# Patient Record
Sex: Male | Born: 1960 | Hispanic: No | Marital: Married | Smoking: Never smoker
Health system: Southern US, Community
[De-identification: ages and names within clinical notes are randomized; demographics above are authoritative.]

## PROBLEM LIST (undated history)

## (undated) DIAGNOSIS — Z789 Other specified health status: Secondary | ICD-10-CM

## (undated) DIAGNOSIS — K649 Unspecified hemorrhoids: Secondary | ICD-10-CM

## (undated) HISTORY — PX: APPENDECTOMY: SHX54

## (undated) HISTORY — PX: TONSILLECTOMY: SUR1361

## (undated) HISTORY — PX: OTHER SURGICAL HISTORY: SHX169

## (undated) HISTORY — DX: Unspecified hemorrhoids: K64.9

---

## 2001-02-20 ENCOUNTER — Encounter: Payer: Self-pay | Admitting: Orthopedic Surgery

## 2001-02-20 ENCOUNTER — Encounter: Admission: RE | Admit: 2001-02-20 | Discharge: 2001-02-20 | Payer: Self-pay | Admitting: Orthopedic Surgery

## 2001-02-21 ENCOUNTER — Encounter: Admission: RE | Admit: 2001-02-21 | Discharge: 2001-02-21 | Payer: Self-pay | Admitting: Orthopedic Surgery

## 2001-02-21 ENCOUNTER — Encounter: Payer: Self-pay | Admitting: Orthopedic Surgery

## 2009-01-12 ENCOUNTER — Encounter: Admission: RE | Admit: 2009-01-12 | Discharge: 2009-01-12 | Payer: Self-pay | Admitting: Orthopedic Surgery

## 2012-06-13 ENCOUNTER — Encounter (INDEPENDENT_AMBULATORY_CARE_PROVIDER_SITE_OTHER): Payer: Self-pay | Admitting: Surgery

## 2012-07-07 ENCOUNTER — Encounter (INDEPENDENT_AMBULATORY_CARE_PROVIDER_SITE_OTHER): Payer: Self-pay | Admitting: Surgery

## 2012-07-07 ENCOUNTER — Ambulatory Visit (INDEPENDENT_AMBULATORY_CARE_PROVIDER_SITE_OTHER): Payer: Managed Care, Other (non HMO) | Admitting: Surgery

## 2012-07-07 VITALS — BP 122/77 | HR 76 | Temp 98.3°F | Resp 14 | Ht 74.0 in | Wt 208.0 lb

## 2012-07-07 DIAGNOSIS — K648 Other hemorrhoids: Secondary | ICD-10-CM | POA: Insufficient documentation

## 2012-07-07 NOTE — Progress Notes (Signed)
Patient ID: Ryan Foley, male   DOB: 09-24-1961, 51 y.o.   MRN: [id number]  Chief Complaint  Patient presents with  . Rectal Problems    HPI Ryan Foley is a 51 y.o. male.  He is referred by Dr. Elnoria Howard for evaluation of a symptomatic prolapsing internal hemorrhoid. He has had this for many years. It is causing bleeding and started to protrude more. He has no discomfort. He denies any incontinence or control issues. He recently had a colonoscopy which was otherwise unremarkable HPI  Past Medical History  Diagnosis Date  . Hemorrhoid     Past Surgical History  Procedure Date  . Appendectomy   . Tonsillectomy   . Broken arm     left    History reviewed. No pertinent family history.  Social History History  Substance Use Topics  . Smoking status: Never Smoker   . Smokeless tobacco: Not on file  . Alcohol Use: Yes    No Known Allergies  Current Outpatient Prescriptions  Medication Sig Dispense Refill  . Multiple Vitamins-Minerals (MULTIVITAMIN WITH MINERALS) tablet Take 1 tablet by mouth daily.        Review of Systems Review of Systems  Constitutional: Negative for fever, chills and unexpected weight change.  HENT: Negative for hearing loss, congestion, sore throat, trouble swallowing and voice change.   Eyes: Negative for visual disturbance.  Respiratory: Negative for cough and wheezing.   Cardiovascular: Negative for chest pain, palpitations and leg swelling.  Gastrointestinal: Positive for blood in stool. Negative for nausea, vomiting, abdominal pain, diarrhea, constipation, abdominal distention, anal bleeding and rectal pain.  Genitourinary: Negative for hematuria and difficulty urinating.  Musculoskeletal: Negative for arthralgias.  Skin: Negative for rash and wound.  Neurological: Negative for seizures, syncope, weakness and headaches.  Hematological: Negative for adenopathy. Does not bruise/bleed easily.  Psychiatric/Behavioral: Negative for  confusion.    Blood pressure 122/77, pulse 76, temperature 98.3 F (36.8 C), temperature source Temporal, resp. rate 14, height 6\' 2"  (1.88 m), weight 208 lb (94.348 kg).  Physical Exam Physical Exam  Constitutional: He is oriented to person, place, and time. He appears well-developed and well-nourished. No distress.  HENT:  Head: Normocephalic and atraumatic.  Right Ear: External ear normal.  Left Ear: External ear normal.  Nose: Nose normal.  Eyes: Conjunctivae are normal. Pupils are equal, round, and reactive to light. No scleral icterus.  Neck: Normal range of motion. Neck supple. No tracheal deviation present.  Cardiovascular: Normal rate, regular rhythm, normal heart sounds and intact distal pulses.   No murmur heard. Pulmonary/Chest: Effort normal and breath sounds normal. No respiratory distress. He has no wheezes. He has no rales.  Abdominal: Soft. He exhibits no distension. There is no tenderness. There is no guarding.  Genitourinary:       The patient has a protruding internal hemorrhoid at the 5:00 position. The rest of the perianal skin is normal. Digital exam is otherwise normal. I performed anoscopy demonstrating the large internal hemorrhoid with no other abnormalities  Musculoskeletal: Normal range of motion. He exhibits no edema and no tenderness.  Lymphadenopathy:    He has no cervical adenopathy.  Neurological: He is alert and oriented to person, place, and time.  Skin: Skin is warm and dry. No rash noted. No erythema.  Psychiatric: His behavior is normal. Judgment normal.    Data Reviewed I have the notes from Dr. Elnoria Howard which I have reviewed  Assessment    Bleeding and prolapsing internal hemorrhoid  Plan    This is a fairly large and broad based internal hemorrhoid. It is close to the external area. I did not believe I can band this without causing discomfort.  I'm therefore recommending internal single column hemorrhoidectomy. I discussed this with him  in detail. I discussed the risks of surgery which includes but not limited to bleeding, infection, recurrence, postop pain, having open wound, etc. He understands and wishes to proceed. Surgery will be scheduled. Likelihood of success is good       Shivonne Schwartzman A 07/07/2012, 9:15 AM

## 2012-09-30 ENCOUNTER — Encounter (INDEPENDENT_AMBULATORY_CARE_PROVIDER_SITE_OTHER): Payer: Self-pay | Admitting: Surgery

## 2012-09-30 ENCOUNTER — Ambulatory Visit (INDEPENDENT_AMBULATORY_CARE_PROVIDER_SITE_OTHER): Payer: Managed Care, Other (non HMO) | Admitting: Surgery

## 2012-09-30 VITALS — BP 98/66 | HR 52 | Temp 98.2°F | Ht 74.0 in | Wt 211.2 lb

## 2012-09-30 DIAGNOSIS — Z01818 Encounter for other preprocedural examination: Secondary | ICD-10-CM

## 2012-09-30 NOTE — Progress Notes (Signed)
Subjective:     Patient ID: Ryan Foley, male   DOB: 1961/05/09, 51 y.o.   MRN: [id number]  HPI He has no issues since I saw him last.  Review of Systems     Objective:   Physical Exam The hemorrhoid remained in the same area    Assessment:     Symptomatic internal Hemorrhoid    Plan:        Surgery is scheduled

## 2012-10-03 ENCOUNTER — Encounter (HOSPITAL_COMMUNITY): Payer: Self-pay | Admitting: Pharmacist

## 2012-10-06 ENCOUNTER — Encounter (HOSPITAL_COMMUNITY): Payer: Self-pay

## 2012-10-06 ENCOUNTER — Encounter (HOSPITAL_COMMUNITY)
Admission: RE | Admit: 2012-10-06 | Discharge: 2012-10-06 | Disposition: A | Discharge status: Home / Self Care | Payer: Managed Care, Other (non HMO) | Source: Ambulatory Visit | Attending: Orthopaedic Surgery | Admitting: Orthopaedic Surgery

## 2012-10-06 HISTORY — DX: Other specified health status: Z78.9

## 2012-10-06 LAB — CBC
MCH: 31.6 pg (ref 26.0–34.0)
MCHC: 34 g/dL (ref 30.0–36.0)
MCV: 93 fL (ref 78.0–100.0)
Platelets: 218 10*3/uL (ref 150–400)
RBC: 4.3 MIL/uL (ref 4.22–5.81)

## 2012-10-06 LAB — BASIC METABOLIC PANEL
BUN: 15 mg/dL (ref 6–23)
CO2: 29 mEq/L (ref 19–32)
Calcium: 9.2 mg/dL (ref 8.4–10.5)
Creatinine, Ser: 0.95 mg/dL (ref 0.50–1.35)
Glucose, Bld: 93 mg/dL (ref 70–99)
Sodium: 137 mEq/L (ref 135–145)

## 2012-10-06 NOTE — Pre-Procedure Instructions (Signed)
20 Ryan Foley  10/06/2012   Your procedure is scheduled on:  10/17/12  Report to [facility] at 1145 AM.  Call this number if you have problems the morning of surgery: [phone]   Remember:   Do not eat food:After Midnight.  May have clear liquids:until Midnight .   Take these medicines the morning of surgery with A SIP OF WATER: none   Do not wear jewelry, make-up or nail polish.  Do not wear lotions, powders, or perfumes. You may wear deodorant.  Do not shave 48 hours prior to surgery. Men may shave face and neck.  Do not bring valuables to the hospital.  Contacts, dentures or bridgework may not be worn into surgery.  Leave suitcase in the car. After surgery it may be brought to your room.  For patients admitted to the hospital, checkout time is 11:00 AM the day of discharge.   Patients discharged the day of surgery will not be allowed to drive home.  Name and phone number of your driver: family  Special Instructions: Shower using CHG 2 nights before surgery and the night before surgery.  If you shower the day of surgery use CHG.  Use special wash - you have one bottle of CHG for all showers.  You should use approximately 1/3 of the bottle for each shower.   Please read over the following fact sheets that you were given: Pain Booklet, Coughing and Deep Breathing, Surgical Site Infection Prevention and Anesthesia Post-op Instructions

## 2012-10-16 NOTE — Progress Notes (Signed)
Pt. Notified of time change to arrive @ 11:00AM.

## 2012-10-16 NOTE — H&P (Signed)
Patient ID: Ryan Foley, male DOB: July 23, 1961, 51 y.o. MRN: [id number]  Chief Complaint   Patient presents with   .  Rectal Problems    HPI  Ryan Foley is a 51 y.o. male. He is referred by Dr. Elnoria Howard for evaluation of a symptomatic prolapsing internal hemorrhoid. He has had this for many years. It is causing bleeding and started to protrude more. He has no discomfort. He denies any incontinence or control issues. He recently had a colonoscopy which was otherwise unremarkable  HPI  Past Medical History   Diagnosis  Date   .  Hemorrhoid     Past Surgical History   Procedure  Date   .  Appendectomy    .  Tonsillectomy    .  Broken arm      left    History reviewed. No pertinent family history.  Social History  History   Substance Use Topics   .  Smoking status:  Never Smoker   .  Smokeless tobacco:  Not on file   .  Alcohol Use:  Yes    No Known Allergies  Current Outpatient Prescriptions   Medication  Sig  Dispense  Refill   .  Multiple Vitamins-Minerals (MULTIVITAMIN WITH MINERALS) tablet  Take 1 tablet by mouth daily.      Review of Systems  Review of Systems  Constitutional: Negative for fever, chills and unexpected weight change.  HENT: Negative for hearing loss, congestion, sore throat, trouble swallowing and voice change.  Eyes: Negative for visual disturbance.  Respiratory: Negative for cough and wheezing.  Cardiovascular: Negative for chest pain, palpitations and leg swelling.  Gastrointestinal: Positive for blood in stool. Negative for nausea, vomiting, abdominal pain, diarrhea, constipation, abdominal distention, anal bleeding and rectal pain.  Genitourinary: Negative for hematuria and difficulty urinating.  Musculoskeletal: Negative for arthralgias.  Skin: Negative for rash and wound.  Neurological: Negative for seizures, syncope, weakness and headaches.  Hematological: Negative for adenopathy. Does not bruise/bleed easily.  Psychiatric/Behavioral:  Negative for confusion.   Blood pressure 122/77, pulse 76, temperature 98.3 F (36.8 C), temperature source Temporal, resp. rate 14, height 6\' 2"  (1.88 m), weight 208 lb (94.348 kg).  Physical Exam  Physical Exam  Constitutional: He is oriented to person, place, and time. He appears well-developed and well-nourished. No distress.  HENT:  Head: Normocephalic and atraumatic.  Right Ear: External ear normal.  Left Ear: External ear normal.  Nose: Nose normal.  Eyes: Conjunctivae are normal. Pupils are equal, round, and reactive to light. No scleral icterus.  Neck: Normal range of motion. Neck supple. No tracheal deviation present.  Cardiovascular: Normal rate, regular rhythm, normal heart sounds and intact distal pulses.  No murmur heard.  Pulmonary/Chest: Effort normal and breath sounds normal. No respiratory distress. He has no wheezes. He has no rales.  Abdominal: Soft. He exhibits no distension. There is no tenderness. There is no guarding.  Genitourinary:  The patient has a protruding internal hemorrhoid at the 5:00 position. The rest of the perianal skin is normal. Digital exam is otherwise normal. I performed anoscopy demonstrating the large internal hemorrhoid with no other abnormalities  Musculoskeletal: Normal range of motion. He exhibits no edema and no tenderness.  Lymphadenopathy:  He has no cervical adenopathy.  Neurological: He is alert and oriented to person, place, and time.  Skin: Skin is warm and dry. No rash noted. No erythema.  Psychiatric: His behavior is normal. Judgment normal.   Data Reviewed  I have  the notes from Dr. Elnoria Howard which I have reviewed  Assessment   Bleeding and prolapsing internal hemorrhoid   Plan   This is a fairly large and broad based internal hemorrhoid. It is close to the external area. I did not believe I can band this without causing discomfort. I'm therefore recommending internal single column hemorrhoidectomy. I discussed this with him in  detail. I discussed the risks of surgery which includes but not limited to bleeding, infection, recurrence, postop pain, having open wound, etc. He understands and wishes to proceed. Surgery will be scheduled. Likelihood of success is good

## 2012-10-17 ENCOUNTER — Ambulatory Visit (HOSPITAL_COMMUNITY)
Admission: RE | Admit: 2012-10-17 | Discharge: 2012-10-17 | Disposition: A | Discharge status: Home / Self Care | Payer: Managed Care, Other (non HMO) | Source: Ambulatory Visit | Attending: Surgery | Admitting: Surgery

## 2012-10-17 ENCOUNTER — Ambulatory Visit (HOSPITAL_COMMUNITY): Payer: Managed Care, Other (non HMO) | Admitting: Anesthesiology

## 2012-10-17 ENCOUNTER — Encounter (HOSPITAL_COMMUNITY): Payer: Self-pay | Admitting: Anesthesiology

## 2012-10-17 ENCOUNTER — Encounter (HOSPITAL_COMMUNITY): Payer: Self-pay | Admitting: *Deleted

## 2012-10-17 ENCOUNTER — Encounter (HOSPITAL_COMMUNITY)
Admission: RE | Disposition: A | Discharge status: Home / Self Care | Payer: Self-pay | Source: Ambulatory Visit | Attending: Surgery

## 2012-10-17 DIAGNOSIS — K648 Other hemorrhoids: Secondary | ICD-10-CM | POA: Insufficient documentation

## 2012-10-17 DIAGNOSIS — Z01812 Encounter for preprocedural laboratory examination: Secondary | ICD-10-CM | POA: Insufficient documentation

## 2012-10-17 DIAGNOSIS — K644 Residual hemorrhoidal skin tags: Secondary | ICD-10-CM | POA: Insufficient documentation

## 2012-10-17 HISTORY — PX: HEMORRHOID SURGERY: SHX153

## 2012-10-17 SURGERY — HEMORRHOIDECTOMY
Anesthesia: General | Site: Rectum | Wound class: Contaminated

## 2012-10-17 MED ORDER — KETOROLAC TROMETHAMINE 30 MG/ML IJ SOLN
INTRAMUSCULAR | Status: DC | PRN
  Administered 2012-10-17: 30 mg via INTRAVENOUS

## 2012-10-17 MED ORDER — LIDOCAINE HCL (CARDIAC) 20 MG/ML IV SOLN
INTRAVENOUS | Status: DC | PRN
  Administered 2012-10-17: 100 mg via INTRAVENOUS

## 2012-10-17 MED ORDER — LACTATED RINGERS IV SOLN
INTRAVENOUS | Status: DC | PRN
  Administered 2012-10-17: 14:00:00 via INTRAVENOUS

## 2012-10-17 MED ORDER — DIBUCAINE 1 % RE OINT
TOPICAL_OINTMENT | RECTAL | Status: AC
  Filled 2012-10-17: qty 28

## 2012-10-17 MED ORDER — OXYCODONE HCL 5 MG PO TABS: 5.0000 mg | ORAL_TABLET | Freq: Once | ORAL | Status: DC | PRN

## 2012-10-17 MED ORDER — HYDROMORPHONE HCL PF 1 MG/ML IJ SOLN: 0.2500 mg | INTRAMUSCULAR | Status: DC | PRN

## 2012-10-17 MED ORDER — 0.9 % SODIUM CHLORIDE (POUR BTL) OPTIME
TOPICAL | Status: DC | PRN
  Administered 2012-10-17: 1000 mL

## 2012-10-17 MED ORDER — HEMOSTATIC AGENTS (NO CHARGE) OPTIME
TOPICAL | Status: DC | PRN
  Administered 2012-10-17: 1 via TOPICAL

## 2012-10-17 MED ORDER — ONDANSETRON HCL 4 MG/2ML IJ SOLN
INTRAMUSCULAR | Status: DC | PRN
  Administered 2012-10-17: 4 mg via INTRAVENOUS

## 2012-10-17 MED ORDER — FENTANYL CITRATE 0.05 MG/ML IJ SOLN
INTRAMUSCULAR | Status: DC | PRN
  Administered 2012-10-17 (×3): 50 ug via INTRAVENOUS

## 2012-10-17 MED ORDER — LACTATED RINGERS IV SOLN
INTRAVENOUS | Status: DC
  Administered 2012-10-17: 13:00:00 via INTRAVENOUS

## 2012-10-17 MED ORDER — DEXAMETHASONE SODIUM PHOSPHATE 4 MG/ML IJ SOLN
INTRAMUSCULAR | Status: DC | PRN
  Administered 2012-10-17: 8 mg via INTRAVENOUS

## 2012-10-17 MED ORDER — BUPIVACAINE LIPOSOME 1.3 % IJ SUSP
INTRAMUSCULAR | Status: DC | PRN
  Administered 2012-10-17: 20 mL

## 2012-10-17 MED ORDER — LIDOCAINE 5 % EX OINT: TOPICAL_OINTMENT | CUTANEOUS | Status: DC | PRN

## 2012-10-17 MED ORDER — CEFAZOLIN SODIUM-DEXTROSE 2-3 GM-% IV SOLR
INTRAVENOUS | Status: AC
  Filled 2012-10-17: qty 50

## 2012-10-17 MED ORDER — MIDAZOLAM HCL 5 MG/5ML IJ SOLN
INTRAMUSCULAR | Status: DC | PRN
  Administered 2012-10-17: 1 mg via INTRAVENOUS

## 2012-10-17 MED ORDER — OXYCODONE HCL 5 MG/5ML PO SOLN: 5.0000 mg | Freq: Once | ORAL | Status: DC | PRN

## 2012-10-17 MED ORDER — PROPOFOL 10 MG/ML IV BOLUS
INTRAVENOUS | Status: DC | PRN
  Administered 2012-10-17: 200 mg via INTRAVENOUS

## 2012-10-17 MED ORDER — BUPIVACAINE-EPINEPHRINE (PF) 0.5% -1:200000 IJ SOLN
INTRAMUSCULAR | Status: AC
  Filled 2012-10-17: qty 10

## 2012-10-17 MED ORDER — PROMETHAZINE HCL 25 MG/ML IJ SOLN: 6.2500 mg | INTRAMUSCULAR | Status: DC | PRN

## 2012-10-17 MED ORDER — HYDROCODONE-ACETAMINOPHEN 5-325 MG PO TABS: 1.0000 | ORAL_TABLET | ORAL | Status: DC | PRN

## 2012-10-17 SURGICAL SUPPLY — 35 items
BLADE SURG 15 STRL LF DISP TIS (BLADE) ×1 IMPLANT
BLADE SURG 15 STRL SS (BLADE) ×2
CANISTER SUCTION 2500CC (MISCELLANEOUS) ×2 IMPLANT
CLOTH BEACON ORANGE TIMEOUT ST (SAFETY) ×2 IMPLANT
COVER SURGICAL LIGHT HANDLE (MISCELLANEOUS) ×2 IMPLANT
DECANTER SPIKE VIAL GLASS SM (MISCELLANEOUS) IMPLANT
DRAPE PROXIMA HALF (DRAPES) ×2 IMPLANT
ELECT CAUTERY BLADE 6.4 (BLADE) ×2 IMPLANT
ELECT REM PT RETURN 9FT ADLT (ELECTROSURGICAL) ×2
ELECTRODE REM PT RTRN 9FT ADLT (ELECTROSURGICAL) ×1 IMPLANT
GAUZE SPONGE 4X4 16PLY XRAY LF (GAUZE/BANDAGES/DRESSINGS) ×2 IMPLANT
GLOVE SURG SIGNA 7.5 PF LTX (GLOVE) ×2 IMPLANT
GOWN PREVENTION PLUS XLARGE (GOWN DISPOSABLE) ×2 IMPLANT
GOWN STRL NON-REIN LRG LVL3 (GOWN DISPOSABLE) ×2 IMPLANT
KIT BASIN OR (CUSTOM PROCEDURE TRAY) ×2 IMPLANT
KIT ROOM TURNOVER OR (KITS) ×2 IMPLANT
NDL HYPO 25GX1X1/2 BEV (NEEDLE) ×1 IMPLANT
NEEDLE HYPO 25GX1X1/2 BEV (NEEDLE) ×2 IMPLANT
NS IRRIG 1000ML POUR BTL (IV SOLUTION) ×2 IMPLANT
PACK LITHOTOMY IV (CUSTOM PROCEDURE TRAY) ×2 IMPLANT
PAD ARMBOARD 7.5X6 YLW CONV (MISCELLANEOUS) ×2 IMPLANT
PENCIL BUTTON HOLSTER BLD 10FT (ELECTRODE) ×2 IMPLANT
SHEARS HARMONIC 9CM CVD (BLADE) ×1 IMPLANT
SPONGE GAUZE 4X4 12PLY (GAUZE/BANDAGES/DRESSINGS) ×2 IMPLANT
SPONGE SURGIFOAM ABS GEL 100 (HEMOSTASIS) ×1 IMPLANT
SURGILUBE 2OZ TUBE FLIPTOP (MISCELLANEOUS) ×2 IMPLANT
SUT CHROMIC 2 0 SH (SUTURE) ×3 IMPLANT
SYR BULB 3OZ (MISCELLANEOUS) ×2 IMPLANT
SYR CONTROL 10ML LL (SYRINGE) ×2 IMPLANT
TOWEL OR 17X24 6PK STRL BLUE (TOWEL DISPOSABLE) ×2 IMPLANT
TOWEL OR 17X26 10 PK STRL BLUE (TOWEL DISPOSABLE) ×2 IMPLANT
TRAY PROCTOSCOPIC FIBER OPTIC (SET/KITS/TRAYS/PACK) IMPLANT
TUBE CONNECTING 12X1/4 (SUCTIONS) ×2 IMPLANT
UNDERPAD 30X30 INCONTINENT (UNDERPADS AND DIAPERS) ×2 IMPLANT
YANKAUER SUCT BULB TIP NO VENT (SUCTIONS) ×2 IMPLANT

## 2012-10-17 NOTE — Anesthesia Postprocedure Evaluation (Signed)
Anesthesia Post Note  Patient: Ryan Foley  Procedure(s) Performed: Procedure(s) (LRB): HEMORRHOIDECTOMY (N/A)  Anesthesia type: general  Patient location: PACU  Post pain: Pain level controlled  Post assessment: Patient's Cardiovascular Status Stable  Last Vitals:  Filed Vitals:   10/17/12 1530  BP:   Pulse: 47  Temp:   Resp: 19    Post vital signs: Reviewed and stable  Level of consciousness: sedated  Complications: No apparent anesthesia complications

## 2012-10-17 NOTE — Transfer of Care (Signed)
Immediate Anesthesia Transfer of Care Note  Patient: Ryan Foley  Procedure(s) Performed: Procedure(s) (LRB) with comments: HEMORRHOIDECTOMY (N/A) - single column internal hemorrhoidectomy  Patient Location: PACU  Anesthesia Type:General  Level of Consciousness: awake, alert , oriented and patient cooperative  Airway & Oxygen Therapy: Patient Spontanous Breathing and Patient connected to nasal cannula oxygen  Post-op Assessment: Report given to PACU RN, Post -op Vital signs reviewed and stable and Patient moving all extremities  Post vital signs: Reviewed and stable  Complications: No apparent anesthesia complications

## 2012-10-17 NOTE — Op Note (Signed)
HEMORRHOIDECTOMY  Procedure Note  CURLEE BOGAN 10/17/2012   Pre-op Diagnosis: prolapsed hemorrhoids     Post-op Diagnosis: same  Procedure(s): 2 column internal/external hemorrhoidectomy  Surgeon(s): Shelly Rubenstein, MD  Anesthesia: General  Staff:  Evelena Peat, RN - Circulator Netta Corrigan, RN - Scrub Person Jani Files, [place] - Float Surgical Tech Maureen Ralphs, RN - Circulator Assistant  Estimated Blood Loss: Minimal               Specimens: hemorrhoids          Oleda Borski A   Date: 10/17/2012  Time: 2:27 PM

## 2012-10-17 NOTE — Preoperative (Signed)
Beta Blockers   Reason not to administer Beta Blockers:Not Applicable 

## 2012-10-17 NOTE — Interval H&P Note (Signed)
History and Physical Interval Note: no change in H and P  10/17/2012 12:13 PM  Ryan Foley  has presented today for surgery, with the diagnosis of prolapsed hemorrhoid  The various methods of treatment have been discussed with the patient and family. After consideration of risks, benefits and other options for treatment, the patient has consented to  Procedure(s) (LRB) with comments: HEMORRHOIDECTOMY (N/A) - single column internal hemorrhoidectomy as a surgical intervention .  The patient's history has been reviewed, patient examined, no change in status, stable for surgery.  I have reviewed the patient's chart and labs.  Questions were answered to the patient's satisfaction.     [name]

## 2012-10-17 NOTE — Progress Notes (Signed)
Care of pt assumed by MA Kaylib Furness RN 

## 2012-10-17 NOTE — Anesthesia Preprocedure Evaluation (Signed)
Anesthesia Evaluation  Patient identified by MRN, date of birth, ID band Patient awake    Reviewed: Allergy & Precautions, H&P , NPO status , Patient's Chart, lab work & pertinent test results  Airway Mallampati: II TM Distance: >3 FB Neck ROM: Full    Dental  (+) Teeth Intact and Dental Advisory Given   Pulmonary neg pulmonary ROS,    Pulmonary exam normal       Cardiovascular     Neuro/Psych negative neurological ROS     GI/Hepatic negative GI ROS, Neg liver ROS,   Endo/Other  negative endocrine ROS  Renal/GU negative Renal ROS     Musculoskeletal   Abdominal   Peds  Hematology   Anesthesia Other Findings   Reproductive/Obstetrics                           Anesthesia Physical Anesthesia Plan  ASA: I  Anesthesia Plan:    Post-op Pain Management:    Induction: Intravenous  Airway Management Planned: LMA  Additional Equipment:   Intra-op Plan:   Post-operative Plan: Extubation in OR  Informed Consent: I have reviewed the patients History and Physical, chart, labs and discussed the procedure including the risks, benefits and alternatives for the proposed anesthesia with the patient or authorized representative who has indicated his/her understanding and acceptance.   Dental advisory given  Plan Discussed with: CRNA, Anesthesiologist and Surgeon  Anesthesia Plan Comments:         Anesthesia Quick Evaluation

## 2012-10-18 NOTE — Op Note (Signed)
NAME:  [name], [name]           ACCOUNT NO.:  [id number]  MEDICAL RECORD NO.:  [id number]  LOCATION:  [facility]                         FACILITY:  [facility]  PHYSICIAN:  Abigail Miyamoto, M.D. DATE OF BIRTH:  Jul 25, 1961  DATE OF PROCEDURE:  10/17/2012 DATE OF DISCHARGE:                              OPERATIVE REPORT   PREOPERATIVE DIAGNOSIS:  Prolapsing hemorrhoids.  POSTOPERATIVE DIAGNOSIS:  Prolapsing hemorrhoids.  PROCEDURE:  Two common internal and external hemorrhoidectomy.  SURGEON:  Abigail Miyamoto, M.D.  ANESTHESIA:  General and injectable bupivacaine.  ESTIMATED BLOOD LOSS:  Minimal.  PROCEDURE IN DETAIL:  The patient was brought to the operating room, identified as Ryan Foley.  He was placed supine on the operating table and general anesthesia was induced.  The patient was placed in the lithotomy position.  His perianal area was then prepped and draped in the usual sterile fashion.  The patient had 2 large protruding internal/external hemorrhoidal columns.  I anesthetized the perianal area circumferentially with Exparel.  I then grasped this hemorrhoidal columns with Allis clamp and excised them in their entirety with the Harmonic Scalpel.  I then closed the mucosal defects with interlocked running 2-0 chromic sutures.  The rest of the perianal exam was normal. I placed a piece of Gelfoam in the anal canal tape with hemostasis.  I then injected further Exparel circumferentially around the perianal area.  Gauze was then applied.  The patient tolerated the procedure well.  All counts were correct at the end of the procedure.  The patient was then extubated in the operating room and taken in a stable condition to the recovery room.     Abigail Miyamoto, M.D.     DB/MEDQ  D:  10/17/2012  T:  10/18/2012  Job:  [id number]

## 2012-10-20 ENCOUNTER — Telehealth (INDEPENDENT_AMBULATORY_CARE_PROVIDER_SITE_OTHER): Payer: Self-pay | Admitting: General Surgery

## 2012-10-20 ENCOUNTER — Encounter (HOSPITAL_COMMUNITY): Payer: Self-pay | Admitting: Surgery

## 2012-10-20 NOTE — Telephone Encounter (Signed)
Pt called for advice on BM.  He had hem surgery on Friday and is feeling pressure to have bowel movement, but cannot go.  Pt stated he may have packing so instructed he will need to remove it first.  He has been taking stool softener and Miralax since DOS.  He states his is reluctant to have a BM, secondary to pain and bleeding potential as well.  Reminded him to shower with soap and water, rinse well and pat dry.  Use warm tube soaks or sitz baths as needed.  Pt understands.

## 2012-10-31 ENCOUNTER — Encounter (INDEPENDENT_AMBULATORY_CARE_PROVIDER_SITE_OTHER): Payer: Self-pay | Admitting: Surgery

## 2012-10-31 ENCOUNTER — Ambulatory Visit (INDEPENDENT_AMBULATORY_CARE_PROVIDER_SITE_OTHER): Payer: Managed Care, Other (non HMO) | Admitting: Surgery

## 2012-10-31 VITALS — BP 110/78 | HR 60 | Temp 97.7°F | Resp 18 | Ht 74.0 in | Wt 210.8 lb

## 2012-10-31 DIAGNOSIS — Z09 Encounter for follow-up examination after completed treatment for conditions other than malignant neoplasm: Secondary | ICD-10-CM

## 2012-10-31 NOTE — Progress Notes (Signed)
Subjective:     Patient ID: Ryan Foley, male   DOB: 1961/04/12, 51 y.o.   MRN: [id number]  HPI He is here for his first postop visit status post 2 column hemorrhoidectomy. He reports he is doing moderately well and has only mild drainage and mild pain  Review of Systems     Objective:   Physical Exam On exam, he still has an open area with excellent granulation tissue in the perianal area. The final pathology showed benign hemorrhoidal tissue    Assessment:     Patient stable postop    Plan:     He will continue his current bowel regimen and I will see him back in one month

## 2012-12-01 ENCOUNTER — Ambulatory Visit (INDEPENDENT_AMBULATORY_CARE_PROVIDER_SITE_OTHER): Payer: Managed Care, Other (non HMO) | Admitting: Surgery

## 2012-12-01 ENCOUNTER — Encounter (INDEPENDENT_AMBULATORY_CARE_PROVIDER_SITE_OTHER): Payer: Self-pay | Admitting: Surgery

## 2012-12-01 VITALS — BP 112/74 | HR 64 | Temp 98.4°F | Resp 16 | Ht 74.0 in | Wt 215.6 lb

## 2012-12-01 DIAGNOSIS — Z09 Encounter for follow-up examination after completed treatment for conditions other than malignant neoplasm: Secondary | ICD-10-CM

## 2012-12-01 NOTE — Progress Notes (Signed)
Subjective:     Patient ID: Ryan Foley, male   DOB: [birth year], 52 y.o.   MRN: [id number]  HPI  He is here for a final postop visit. He is off stool softeners and moving his bowels well with no drainage Review of Systems     Objective:   Physical Exam His wounds are completely healed    Assessment:     Patient stable postop    Plan:     I will see him back as needed

## 2017-02-02 ENCOUNTER — Encounter (HOSPITAL_COMMUNITY): Payer: Self-pay | Admitting: *Deleted

## 2017-02-02 ENCOUNTER — Ambulatory Visit (HOSPITAL_COMMUNITY)
Admission: EM | Admit: 2017-02-02 | Discharge: 2017-02-02 | Disposition: A | Discharge status: Home / Self Care | Payer: 59 | Attending: Radiology | Admitting: Radiology

## 2017-02-02 DIAGNOSIS — J069 Acute upper respiratory infection, unspecified: Secondary | ICD-10-CM

## 2017-02-02 MED ORDER — BENZONATATE 100 MG PO CAPS: 100.0000 mg | ORAL_CAPSULE | Freq: Three times a day (TID) | ORAL | 0 refills | Status: DC

## 2017-02-02 MED ORDER — CYCLOBENZAPRINE HCL 10 MG PO TABS: 10.0000 mg | ORAL_TABLET | Freq: Two times a day (BID) | ORAL | 0 refills | Status: DC | PRN

## 2017-02-02 MED ORDER — AMOXICILLIN-POT CLAVULANATE 875-125 MG PO TABS: 1.0000 | ORAL_TABLET | Freq: Two times a day (BID) | ORAL | 0 refills | Status: DC

## 2017-02-02 NOTE — Discharge Instructions (Signed)
Continue to push fluids and take over the counter medications as directed on the back of the box for symptomatic relief.  ° °

## 2017-02-02 NOTE — ED Provider Notes (Signed)
CSN: [id number]     Arrival date & time 02/02/17  1217 History   None    Chief Complaint  Patient presents with  . Cough   (Consider location/radiation/quality/duration/timing/severity/associated sxs/prior Treatment) 56 y.o. male presents with productive cough that is worse at night, nasal congestion and sore throat X 4 days and lower back pain that occurred while gardening. Condition is acute in nature. Condition is made better by nothing. Condition is made worse by nothing. Patient denies any relief from OTC medications taken prior to there arrival at this facility.        Past Medical History:  Diagnosis Date  . Hemorrhoid   . No pertinent past medical history    Past Surgical History:  Procedure Laterality Date  . APPENDECTOMY    . broken arm     left  . HEMORRHOID SURGERY  10/17/2012   Procedure: HEMORRHOIDECTOMY;  Surgeon: Shelly Rubenstein, MD;  Location: [facility] OR;  Service: General;  Laterality: N/A;  single column internal hemorrhoidectomy  . TONSILLECTOMY     History reviewed. No pertinent family history. Social History  Substance Use Topics  . Smoking status: Never Smoker  . Smokeless tobacco: Never Used  . Alcohol use Yes    Review of Systems  Constitutional: Negative for chills and fever.  HENT: Positive for congestion and sore throat. Negative for ear pain and sinus pain.   Eyes: Negative for pain and visual disturbance.  Respiratory: Positive for cough. Negative for shortness of breath.   Cardiovascular: Negative for chest pain and palpitations.  Gastrointestinal: Negative for abdominal pain and vomiting.  Genitourinary: Negative for dysuria and hematuria.  Musculoskeletal: Negative for arthralgias and back pain.  Skin: Negative for color change and rash.  Neurological: Negative for seizures and syncope.  All other systems reviewed and are negative.   Allergies  Patient has no known allergies.  Home Medications   Prior to Admission medications    Medication Sig Start Date End Date Taking? Authorizing Provider  amoxicillin-clavulanate (AUGMENTIN) 875-125 MG tablet Take 1 tablet by mouth every 12 (twelve) hours. 02/02/17   Alene Mires, NP  benzonatate (TESSALON) 100 MG capsule Take 1 capsule (100 mg total) by mouth every 8 (eight) hours. 02/02/17   Alene Mires, NP  cyclobenzaprine (FLEXERIL) 10 MG tablet Take 1 tablet (10 mg total) by mouth 2 (two) times daily as needed for muscle spasms. 02/02/17   Alene Mires, NP   Meds Ordered and Administered this Visit  Medications - No data to display  BP 134/80 (BP Location: Right Arm)   Pulse 78   Temp 98.6 F (37 C) (Oral)   Resp 18  No data found.   Physical Exam  Constitutional: He appears well-developed and well-nourished.  HENT:  Head: Normocephalic and atraumatic.  Right Ear: External ear normal.  Left Ear: External ear normal.   Post nasal drip noted.   Eyes: Conjunctivae are normal.  Neck: Neck supple.  Cardiovascular: Normal rate, regular rhythm and normal heart sounds.   No murmur heard. Pulmonary/Chest: Effort normal and breath sounds normal. No respiratory distress. He has no wheezes. He has no rales.  Abdominal: Soft. There is no tenderness.  Musculoskeletal: He exhibits no edema.  Neurological: He is alert.  Skin: Skin is warm and dry.  Psychiatric: He has a normal mood and affect.  Nursing note and vitals reviewed.   Urgent Care Course     Procedures (including critical care time)  Labs Review Labs Reviewed -  No data to display  Imaging Review No results found.      MDM   1. Viral upper respiratory tract infection       Alene MiresJennifer C Tanequa Kretz, NP 02/02/17 1327

## 2017-02-02 NOTE — ED Triage Notes (Signed)
Pt  Reports   Cough    And  Congested   Body  Aches   X   3days        Back  Pain

## 2017-04-03 ENCOUNTER — Other Ambulatory Visit: Payer: Self-pay

## 2017-04-03 ENCOUNTER — Encounter (HOSPITAL_COMMUNITY): Payer: Self-pay

## 2017-04-03 ENCOUNTER — Emergency Department (HOSPITAL_COMMUNITY): Payer: 59

## 2017-04-03 ENCOUNTER — Emergency Department (HOSPITAL_COMMUNITY)
Admission: EM | Admit: 2017-04-03 | Discharge: 2017-04-03 | Disposition: A | Discharge status: Home / Self Care | Payer: 59 | Attending: Emergency Medicine | Admitting: Emergency Medicine

## 2017-04-03 DIAGNOSIS — Z79899 Other long term (current) drug therapy: Secondary | ICD-10-CM | POA: Insufficient documentation

## 2017-04-03 DIAGNOSIS — R0789 Other chest pain: Secondary | ICD-10-CM | POA: Diagnosis present

## 2017-04-03 DIAGNOSIS — H8110 Benign paroxysmal vertigo, unspecified ear: Secondary | ICD-10-CM | POA: Diagnosis not present

## 2017-04-03 LAB — I-STAT TROPONIN, ED
TROPONIN I, POC: 0.01 ng/mL (ref 0.00–0.08)
Troponin i, poc: 0.02 ng/mL (ref 0.00–0.08)

## 2017-04-03 LAB — BASIC METABOLIC PANEL
ANION GAP: 9 (ref 5–15)
BUN: 21 mg/dL — ABNORMAL HIGH (ref 6–20)
CHLORIDE: 106 mmol/L (ref 101–111)
CO2: 23 mmol/L (ref 22–32)
Calcium: 9 mg/dL (ref 8.9–10.3)
Creatinine, Ser: 0.98 mg/dL (ref 0.61–1.24)
GFR calc Af Amer: >60 mL/min (ref >60)
GLUCOSE: 99 mg/dL (ref 65–99)
POTASSIUM: 3.9 mmol/L (ref 3.5–5.1)
Sodium: 138 mmol/L (ref 135–145)

## 2017-04-03 LAB — CBC
HEMATOCRIT: 40.5 % (ref 39.0–52.0)
HEMOGLOBIN: 13.8 g/dL (ref 13.0–17.0)
MCH: 31.5 pg (ref 26.0–34.0)
MCHC: 34.1 g/dL (ref 30.0–36.0)
MCV: 92.5 fL (ref 78.0–100.0)
Platelets: 208 10*3/uL (ref 150–400)
RBC: 4.38 MIL/uL (ref 4.22–5.81)
RDW: 13 % (ref 11.5–15.5)
WBC: 5.3 10*3/uL (ref 4.0–10.5)

## 2017-04-03 NOTE — ED Provider Notes (Signed)
Assumed care from Dr. Elesa MassedWard at shift change.  See her note for full H&P.  Briefly, 56 y.o. M here with 15 seconds of left sided chest pain last evening.  Initial labs and CXR negative.  EKG without acute ischemic changes.  Patient's father did have mild MI in 6940's.  Patient without other known cardiac risk factors.  Plan: delta trop at 0730, if negative can discharge home.  Results for orders placed or performed during the hospital encounter of 04/03/17  Basic metabolic panel  Result Value Ref Range   Sodium 138 135 - 145 mmol/L   Potassium 3.9 3.5 - 5.1 mmol/L   Chloride 106 101 - 111 mmol/L   CO2 23 22 - 32 mmol/L   Glucose, Bld 99 65 - 99 mg/dL   BUN 21 (H) 6 - 20 mg/dL   Creatinine, Ser 1.300.98 0.61 - 1.24 mg/dL   Calcium 9.0 8.9 - 86.510.3 mg/dL   GFR calc non Af Amer >60 >60 mL/min   GFR calc Af Amer >60 >60 mL/min   Anion gap 9 5 - 15  CBC  Result Value Ref Range   WBC 5.3 4.0 - 10.5 K/uL   RBC 4.38 4.22 - 5.81 MIL/uL   Hemoglobin 13.8 13.0 - 17.0 g/dL   HCT 78.440.5 69.639.0 - 29.552.0 %   MCV 92.5 78.0 - 100.0 fL   MCH 31.5 26.0 - 34.0 pg   MCHC 34.1 30.0 - 36.0 g/dL   RDW 28.413.0 13.211.5 - 44.015.5 %   Platelets 208 150 - 400 K/uL  I-stat troponin, ED  Result Value Ref Range   Troponin i, poc 0.02 0.00 - 0.08 ng/mL   Comment 3          I-stat troponin, ED  Result Value Ref Range   Troponin i, poc 0.01 0.00 - 0.08 ng/mL   Comment 3           Dg Chest 2 View  Result Date: 04/03/2017 CLINICAL DATA:  Chest pain and left arm pain day awoke the patient up tonight. EXAM: CHEST  2 VIEW COMPARISON:  None. FINDINGS: Normal heart size and pulmonary vascularity. Emphysematous changes in the lungs. No focal consolidation or airspace disease. No blunting of costophrenic angles. No pneumothorax. Mediastinal contours appear intact. IMPRESSION: Emphysematous changes in the lungs. No evidence of active pulmonary disease. Electronically Signed   By: Burman NievesWilliam  Stevens M.D.   On: 04/03/2017 04:43     Delta trop  negative.  Patient has been resting here, remains chest pain free. Stable VS.  Feel he is stable for discharge home.  Recommended close PCP follow-up-- given copies of labs and imaging studies from today;s visit for physician review.  Discussed plan with patient, he acknowledged understanding and agreed with plan of care.  Return precautions given for new or worsening symptoms.   Garlon HatchetSanders, Mabel Unrein M, PA-C 04/03/17 [id number]    Lorre NickAllen, Anthony, MD 04/03/17 [phone]

## 2017-04-03 NOTE — ED Triage Notes (Signed)
Pt here for chest pain onset last night at 1 am. sts recently had work up for heart and was good. Had episode recently of blacking out that led him to the work up. Then now with chest pain and minor vertigo.

## 2017-04-03 NOTE — Discharge Instructions (Addendum)
Your cardiac work up today was negative. Recommend to follow-up with your primary care doctor. Return here for any new/worsening symptoms.

## 2017-04-03 NOTE — ED Notes (Signed)
ekg shown to dr Blinda Leatherwoodpollina

## 2017-04-03 NOTE — ED Provider Notes (Signed)
TIME SEEN: 5:30 AM  CHIEF COMPLAINT: Chest pain  HPI: Patient is a 56 year old male with no synovial past medical history who presents emergency department with complaints of chest pain. States that around 1 AM the chest pain will confirm sleep. He describes it as diffusely throughout the left chest into the left abdomen and lasted probably 10-15 seconds. He states he felt like it was a tightness and a "muscle ache". Had associated shortness of breath but no nausea, vomiting, diaphoresis or dizziness. Has intermittently had vertigo since March. States that is worse with sitting upright and movement. No significant vertigo currently. Has meclizine at home. No headache, vision changes, numbness, tingling or focal weakness. He denies history of hypertension, diabetes, hyperlipidemia, tobacco use. States his father did have a "mild heart attack" in his 240s. Patient reports he has had a stress test in his 30s that was negative. No recent stress test or cardiac catheterization. [place] [address].  He is chest pain-free currently.  ROS: See HPI Constitutional: no fever  Eyes: no drainage  ENT: no runny nose   Cardiovascular:   chest pain  Resp:  SOB  GI: no vomiting GU: no dysuria Integumentary: no rash  Allergy: no hives  Musculoskeletal: no leg swelling  Neurological: no slurred speech ROS otherwise negative  PAST MEDICAL HISTORY/PAST SURGICAL HISTORY:  Past Medical History:  Diagnosis Date  . Hemorrhoid   . No pertinent past medical history     MEDICATIONS:  Prior to Admission medications   Medication Sig Start Date End Date Taking? Authorizing Provider  ZYLET 0.5-0.3 % SUSP Place 1 drop into the left eye 4 (four) times daily.  04/01/17  Yes [provider]  amoxicillin-clavulanate (AUGMENTIN) 875-125 MG tablet Take 1 tablet by mouth every 12 (twelve) hours. Patient not taking: Reported on 04/03/2017 02/02/17   Alene Miresmohundro, Jennifer C, NP   benzonatate (TESSALON) 100 MG capsule Take 1 capsule (100 mg total) by mouth every 8 (eight) hours. Patient not taking: Reported on 04/03/2017 02/02/17   Alene Miresmohundro, Jennifer C, NP  cyclobenzaprine (FLEXERIL) 10 MG tablet Take 1 tablet (10 mg total) by mouth 2 (two) times daily as needed for muscle spasms. Patient not taking: Reported on 04/03/2017 02/02/17   Alene Miresmohundro, Jennifer C, NP    ALLERGIES:  No Known Allergies  SOCIAL HISTORY:  Social History  Substance Use Topics  . Smoking status: Never Smoker  . Smokeless tobacco: Never Used  . Alcohol use Yes    FAMILY HISTORY: History reviewed. No pertinent family history.  EXAM: BP (!) 119/91 (BP Location: Right Arm)   Pulse (!) 57   Temp 97.8 F (36.6 C) (Oral)   Resp 17   Ht 6\' 2"  (1.88 m)   Wt 97.5 kg (215 lb)   SpO2 99%   BMI 27.60 kg/m  CONSTITUTIONAL: Alert and oriented and responds appropriately to questions. Well-appearing; well-nourished HEAD: Normocephalic EYES: Conjunctivae clear, pupils appear equal, EOMI ENT: normal nose; moist mucous membranes NECK: Supple, no meningismus, no nuchal rigidity, no LAD  CARD: RRR; S1 and S2 appreciated; no murmurs, no clicks, no rubs, no gallops RESP: Normal chest excursion without splinting or tachypnea; breath sounds clear and equal bilaterally; no wheezes, no rhonchi, no rales, no hypoxia or respiratory distress, speaking full sentences ABD/GI: Normal bowel sounds; non-distended; soft, non-tender, no rebound, no guarding, no peritoneal signs, no hepatosplenomegaly BACK:  The back appears normal and is non-tender to palpation, there is no CVA tenderness  EXT: Normal ROM in all joints; non-tender to palpation; no edema; normal capillary refill; no cyanosis, no calf tenderness or swelling    SKIN: Normal color for age and race; warm; no rash NEURO: Moves all extremities equally, sensation to light touch intact diffusely, cranial nerves II through XII intact, normal speech PSYCH: The  patient's mood and manner are appropriate. Grooming and personal hygiene are appropriate.  MEDICAL DECISION MAKING: Patient here symptoms of very atypical chest pain. His only risk factors for ACS are age and family history. First troponin negative. EKG shows no ischemic abnormality. Chest x-ray clear. Plan is to repeat second troponin 3 hours and if negative discharge home with outpatient follow-up.   Patient also complains of intermittent vertigo since March. Symptoms only present with changes in position. Otherwise neurologically intact. No headache. Denies vertigo currently and denies meclizine. Suspect that this is peripheral in nature. Doubt stroke. I do not feel he needs emergent head imaging. Has prescription for meclizine at home.  ED PROGRESS: 7:00 AM  Signed out to Sharilyn Sites, PA who will follow-up on patient's repeat troponin and reassess symptoms. Again if patient is asymptomatic and second troponin is negative, I feel comfortable with discharge home as does patient.  I reviewed all nursing notes, vitals, pertinent previous records, EKGs, lab and urine results, imaging (as available).      EKG Interpretation  Date/Time:  Wednesday Apr 03 2017 05:36:56 EDT Ventricular Rate:  53 PR Interval:    QRS Duration: 92 QT Interval:  438 QTC Calculation: 412 R Axis:   90 Text Interpretation:  Sinus rhythm Borderline right axis deviation Anteroseptal infarct, old No old tracing to compare Confirmed by WARD,  DO, KRISTEN [id number]) on 04/03/2017 6:50:47 AM         Ward, Layla Maw, DO 04/03/17 1914

## 2017-11-09 ENCOUNTER — Inpatient Hospital Stay (HOSPITAL_COMMUNITY)
Admission: EM | Admit: 2017-11-09 | Discharge: 2017-11-11 | DRG: 470 | Disposition: A | Discharge status: Home / Self Care | Payer: 59 | Attending: Orthopedic Surgery | Admitting: Orthopedic Surgery

## 2017-11-09 ENCOUNTER — Other Ambulatory Visit: Payer: Self-pay

## 2017-11-09 ENCOUNTER — Emergency Department (HOSPITAL_COMMUNITY): Payer: 59

## 2017-11-09 ENCOUNTER — Inpatient Hospital Stay (HOSPITAL_COMMUNITY): Payer: 59

## 2017-11-09 ENCOUNTER — Encounter (HOSPITAL_COMMUNITY): Payer: Self-pay

## 2017-11-09 DIAGNOSIS — R402413 Glasgow coma scale score 13-15, at hospital admission: Secondary | ICD-10-CM | POA: Diagnosis present

## 2017-11-09 DIAGNOSIS — W500XXA Accidental hit or strike by another person, initial encounter: Secondary | ICD-10-CM | POA: Diagnosis present

## 2017-11-09 DIAGNOSIS — Z8781 Personal history of (healed) traumatic fracture: Secondary | ICD-10-CM | POA: Insufficient documentation

## 2017-11-09 DIAGNOSIS — Z419 Encounter for procedure for purposes other than remedying health state, unspecified: Secondary | ICD-10-CM

## 2017-11-09 DIAGNOSIS — S72001A Fracture of unspecified part of neck of right femur, initial encounter for closed fracture: Principal | ICD-10-CM | POA: Diagnosis present

## 2017-11-09 DIAGNOSIS — Z01811 Encounter for preprocedural respiratory examination: Secondary | ICD-10-CM

## 2017-11-09 DIAGNOSIS — Y9355 Activity, bike riding: Secondary | ICD-10-CM | POA: Diagnosis not present

## 2017-11-09 DIAGNOSIS — S72009A Fracture of unspecified part of neck of unspecified femur, initial encounter for closed fracture: Secondary | ICD-10-CM | POA: Diagnosis not present

## 2017-11-09 HISTORY — DX: Other specified health status: Z78.9

## 2017-11-09 LAB — COMPREHENSIVE METABOLIC PANEL
ALK PHOS: 48 U/L (ref 38–126)
ALT: 21 U/L (ref 17–63)
AST: 33 U/L (ref 15–41)
Albumin: 3.8 g/dL (ref 3.5–5.0)
Anion gap: 5 (ref 5–15)
BUN: 15 mg/dL (ref 6–20)
CALCIUM: 8.9 mg/dL (ref 8.9–10.3)
CHLORIDE: 104 mmol/L (ref 101–111)
CO2: 27 mmol/L (ref 22–32)
CREATININE: 1.08 mg/dL (ref 0.61–1.24)
Glucose, Bld: 100 mg/dL — ABNORMAL HIGH (ref 65–99)
Potassium: 3.8 mmol/L (ref 3.5–5.1)
Sodium: 136 mmol/L (ref 135–145)
Total Bilirubin: 0.7 mg/dL (ref 0.3–1.2)
Total Protein: 6.3 g/dL — ABNORMAL LOW (ref 6.5–8.1)

## 2017-11-09 LAB — CBC WITH DIFFERENTIAL/PLATELET
BASOS PCT: 0 %
Basophils Absolute: 0 10*3/uL (ref 0.0–0.1)
EOS ABS: 0.3 10*3/uL (ref 0.0–0.7)
EOS PCT: 4 %
HCT: 42.2 % (ref 39.0–52.0)
Hemoglobin: 14 g/dL (ref 13.0–17.0)
LYMPHS ABS: 1 10*3/uL (ref 0.7–4.0)
Lymphocytes Relative: 14 %
MCH: 31 pg (ref 26.0–34.0)
MCHC: 33.2 g/dL (ref 30.0–36.0)
MCV: 93.4 fL (ref 78.0–100.0)
MONOS PCT: 5 %
Monocytes Absolute: 0.3 10*3/uL (ref 0.1–1.0)
NEUTROS PCT: 77 %
Neutro Abs: 5.1 10*3/uL (ref 1.7–7.7)
PLATELETS: 201 10*3/uL (ref 150–400)
RBC: 4.52 MIL/uL (ref 4.22–5.81)
RDW: 12.8 % (ref 11.5–15.5)
WBC: 6.6 10*3/uL (ref 4.0–10.5)

## 2017-11-09 LAB — URINALYSIS, ROUTINE W REFLEX MICROSCOPIC
BILIRUBIN URINE: NEGATIVE
GLUCOSE, UA: NEGATIVE mg/dL
HGB URINE DIPSTICK: NEGATIVE
KETONES UR: 5 mg/dL — AB
Leukocytes, UA: NEGATIVE
Nitrite: NEGATIVE
PH: 6 (ref 5.0–8.0)
PROTEIN: NEGATIVE mg/dL
Specific Gravity, Urine: 1.015 (ref 1.005–1.030)

## 2017-11-09 LAB — TYPE AND SCREEN
ABO/RH(D): AB POS
ANTIBODY SCREEN: NEGATIVE

## 2017-11-09 LAB — ABO/RH: ABO/RH(D): AB POS

## 2017-11-09 LAB — APTT: APTT: 30 s (ref 24–36)

## 2017-11-09 MED ORDER — SENNOSIDES-DOCUSATE SODIUM 8.6-50 MG PO TABS: 1.0000 | ORAL_TABLET | Freq: Every evening | ORAL | Status: DC | PRN

## 2017-11-09 MED ORDER — HYDROCODONE-ACETAMINOPHEN 5-325 MG PO TABS
1.0000 | ORAL_TABLET | Freq: Four times a day (QID) | ORAL | Status: DC | PRN
  Administered 2017-11-09 – 2017-11-10 (×3): 2 via ORAL
  Filled 2017-11-09 (×3): qty 2

## 2017-11-09 MED ORDER — LOTEPREDNOL-TOBRAMYCIN 0.5-0.3 % OP SUSP: 1.0000 [drp] | Freq: Four times a day (QID) | OPHTHALMIC | Status: DC

## 2017-11-09 MED ORDER — HEPARIN SODIUM (PORCINE) 5000 UNIT/ML IJ SOLN: 5000.0000 [IU] | Freq: Three times a day (TID) | INTRAMUSCULAR | Status: DC

## 2017-11-09 MED ORDER — HYDROMORPHONE HCL 1 MG/ML IJ SOLN
0.5000 mg | Freq: Once | INTRAMUSCULAR | Status: AC
  Administered 2017-11-09: 0.5 mg via INTRAVENOUS
  Filled 2017-11-09: qty 1

## 2017-11-09 MED ORDER — HYDROMORPHONE HCL 1 MG/ML IJ SOLN
0.5000 mg | INTRAMUSCULAR | Status: DC | PRN
  Administered 2017-11-09: 0.5 mg via INTRAVENOUS
  Filled 2017-11-09: qty 1

## 2017-11-09 MED ORDER — MORPHINE SULFATE (PF) 4 MG/ML IV SOLN
4.0000 mg | Freq: Once | INTRAVENOUS | Status: AC
  Administered 2017-11-09: 4 mg via INTRAVENOUS
  Filled 2017-11-09: qty 1

## 2017-11-09 MED ORDER — SODIUM CHLORIDE 0.9 % IV BOLUS (SEPSIS)
1000.0000 mL | Freq: Once | INTRAVENOUS | Status: AC
  Administered 2017-11-09: 1000 mL via INTRAVENOUS

## 2017-11-09 MED ORDER — TETANUS-DIPHTH-ACELL PERTUSSIS 5-2.5-18.5 LF-MCG/0.5 IM SUSP
0.5000 mL | Freq: Once | INTRAMUSCULAR | Status: AC
  Administered 2017-11-09: 0.5 mL via INTRAMUSCULAR
  Filled 2017-11-09: qty 0.5

## 2017-11-09 MED ORDER — HYDROMORPHONE HCL 1 MG/ML IJ SOLN: 0.5000 mg | INTRAMUSCULAR | Status: DC | PRN

## 2017-11-09 MED ORDER — ONDANSETRON HCL 4 MG/2ML IJ SOLN
4.0000 mg | Freq: Once | INTRAMUSCULAR | Status: AC
  Administered 2017-11-09: 4 mg via INTRAVENOUS
  Filled 2017-11-09: qty 2

## 2017-11-09 MED ORDER — BISACODYL 5 MG PO TBEC: 5.0000 mg | DELAYED_RELEASE_TABLET | Freq: Every day | ORAL | Status: DC | PRN

## 2017-11-09 MED ORDER — FLEET ENEMA 7-19 GM/118ML RE ENEM: 1.0000 | ENEMA | Freq: Once | RECTAL | Status: DC | PRN

## 2017-11-09 MED ORDER — SODIUM CHLORIDE 0.9 % IV SOLN
INTRAVENOUS | Status: DC
  Administered 2017-11-09: 15:00:00 via INTRAVENOUS
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
## 2017-11-09 NOTE — ED Notes (Signed)
Patient transported to X-ray 
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
## 2017-11-09 NOTE — Progress Notes (Signed)
Orthopedic Tech Progress Note Patient Details:  Vaughan BastaMichael F Hunton [birth year] [id number]      Post Interventions Patient Tolerated: Fair Instructions Provided: Care of device, Adjustment of device   Saul FordyceJennifer C Edlin Ford 11/09/2017, 3:46 PM
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
## 2017-11-09 NOTE — ED Notes (Signed)
Attempted to call report

## 2017-11-09 NOTE — Progress Notes (Signed)
Orthopedic Tech Progress Note Patient Details:  Vaughan BastaMichael F Rossman 09-16-1961 [id number]      Post Interventions Patient Tolerated: Well Instructions Provided: Care of device, Adjustment of device   Saul FordyceJennifer C Giorgi Debruin 11/09/2017, 6:21 PM

## 2017-11-10 ENCOUNTER — Other Ambulatory Visit: Payer: Self-pay | Admitting: Internal Medicine

## 2017-11-10 ENCOUNTER — Inpatient Hospital Stay (HOSPITAL_COMMUNITY): Payer: 59 | Admitting: Certified Registered Nurse Anesthetist

## 2017-11-10 ENCOUNTER — Encounter (HOSPITAL_COMMUNITY)
Admission: EM | Disposition: A | Discharge status: Home / Self Care | Payer: Self-pay | Source: Home / Self Care | Attending: Orthopedic Surgery

## 2017-11-10 ENCOUNTER — Encounter (HOSPITAL_COMMUNITY): Payer: Self-pay | Admitting: Certified Registered Nurse Anesthetist

## 2017-11-10 ENCOUNTER — Inpatient Hospital Stay (HOSPITAL_COMMUNITY): Payer: 59

## 2017-11-10 DIAGNOSIS — Z8781 Personal history of (healed) traumatic fracture: Secondary | ICD-10-CM

## 2017-11-10 HISTORY — PX: TOTAL HIP ARTHROPLASTY: SHX124

## 2017-11-10 LAB — BASIC METABOLIC PANEL
Anion gap: 7 (ref 5–15)
BUN: 10 mg/dL (ref 6–20)
CALCIUM: 8.4 mg/dL — AB (ref 8.9–10.3)
CHLORIDE: 102 mmol/L (ref 101–111)
CO2: 26 mmol/L (ref 22–32)
CREATININE: 1.03 mg/dL (ref 0.61–1.24)
Glucose, Bld: 111 mg/dL — ABNORMAL HIGH (ref 65–99)
Potassium: 3.9 mmol/L (ref 3.5–5.1)
SODIUM: 135 mmol/L (ref 135–145)

## 2017-11-10 LAB — CBC
HCT: 40 % (ref 39.0–52.0)
HEMOGLOBIN: 13.2 g/dL (ref 13.0–17.0)
MCH: 31.5 pg (ref 26.0–34.0)
MCHC: 33 g/dL (ref 30.0–36.0)
MCV: 95.5 fL (ref 78.0–100.0)
PLATELETS: 215 10*3/uL (ref 150–400)
RBC: 4.19 MIL/uL — ABNORMAL LOW (ref 4.22–5.81)
RDW: 13.2 % (ref 11.5–15.5)
WBC: 8 10*3/uL (ref 4.0–10.5)

## 2017-11-10 LAB — MRSA PCR SCREENING: MRSA BY PCR: NEGATIVE

## 2017-11-10 LAB — HIV ANTIBODY (ROUTINE TESTING W REFLEX): HIV SCREEN 4TH GENERATION: NONREACTIVE

## 2017-11-10 SURGERY — ARTHROPLASTY, HIP, TOTAL, ANTERIOR APPROACH
Anesthesia: General | Site: Hip | Laterality: Right

## 2017-11-10 MED ORDER — ONDANSETRON HCL 4 MG/2ML IJ SOLN
INTRAMUSCULAR | Status: AC
  Filled 2017-11-10: qty 2

## 2017-11-10 MED ORDER — PROPOFOL 10 MG/ML IV BOLUS
INTRAVENOUS | Status: AC
  Filled 2017-11-10: qty 20

## 2017-11-10 MED ORDER — DOCUSATE SODIUM 100 MG PO CAPS
100.0000 mg | ORAL_CAPSULE | Freq: Two times a day (BID) | ORAL | 0 refills | Status: DC
Start: 2017-11-10 — End: 2022-12-04

## 2017-11-10 MED ORDER — LIDOCAINE 2% (20 MG/ML) 5 ML SYRINGE
INTRAMUSCULAR | Status: DC | PRN
  Administered 2017-11-10: 60 mg via INTRAVENOUS

## 2017-11-10 MED ORDER — GABAPENTIN 300 MG PO CAPS
300.0000 mg | ORAL_CAPSULE | Freq: Once | ORAL | Status: AC
  Administered 2017-11-10: 300 mg via ORAL
  Filled 2017-11-10: qty 1

## 2017-11-10 MED ORDER — METHOCARBAMOL 500 MG PO TABS: 500.0000 mg | ORAL_TABLET | Freq: Four times a day (QID) | ORAL | 0 refills | Status: DC | PRN

## 2017-11-10 MED ORDER — DOCUSATE SODIUM 100 MG PO CAPS
100.0000 mg | ORAL_CAPSULE | Freq: Two times a day (BID) | ORAL | Status: DC
  Administered 2017-11-10 – 2017-11-11 (×3): 100 mg via ORAL
  Filled 2017-11-10 (×3): qty 1

## 2017-11-10 MED ORDER — TRANEXAMIC ACID 1000 MG/10ML IV SOLN
INTRAVENOUS | Status: AC | PRN
  Administered 2017-11-10: 2000 mg via TOPICAL

## 2017-11-10 MED ORDER — ONDANSETRON HCL 4 MG PO TABS: 4.0000 mg | ORAL_TABLET | Freq: Three times a day (TID) | ORAL | 0 refills | Status: DC | PRN

## 2017-11-10 MED ORDER — ACETAMINOPHEN 650 MG RE SUPP: 650.0000 mg | RECTAL | Status: DC | PRN

## 2017-11-10 MED ORDER — LACTATED RINGERS IV SOLN
INTRAVENOUS | Status: DC | PRN
  Administered 2017-11-10: 07:00:00 via INTRAVENOUS

## 2017-11-10 MED ORDER — POLYETHYLENE GLYCOL 3350 17 G PO PACK: 17.0000 g | PACK | Freq: Every day | ORAL | Status: DC | PRN

## 2017-11-10 MED ORDER — HYDROCODONE-ACETAMINOPHEN 5-325 MG PO TABS
1.0000 | ORAL_TABLET | ORAL | Status: DC | PRN
  Administered 2017-11-10: 2 via ORAL
  Filled 2017-11-10 (×3): qty 2

## 2017-11-10 MED ORDER — SODIUM CHLORIDE FLUSH 0.9 % IV SOLN
INTRAVENOUS | Status: DC | PRN
  Administered 2017-11-10: 30 mL via INTRAVENOUS

## 2017-11-10 MED ORDER — SORBITOL 70 % SOLN: 30.0000 mL | Freq: Every day | Status: DC | PRN

## 2017-11-10 MED ORDER — ONDANSETRON HCL 4 MG PO TABS: 4.0000 mg | ORAL_TABLET | Freq: Four times a day (QID) | ORAL | Status: DC | PRN

## 2017-11-10 MED ORDER — FENTANYL CITRATE (PF) 250 MCG/5ML IJ SOLN
INTRAMUSCULAR | Status: DC | PRN
  Administered 2017-11-10: 100 ug via INTRAVENOUS
  Administered 2017-11-10: 50 ug via INTRAVENOUS

## 2017-11-10 MED ORDER — DIPHENHYDRAMINE HCL 12.5 MG/5ML PO ELIX: 12.5000 mg | ORAL_SOLUTION | ORAL | Status: DC | PRN

## 2017-11-10 MED ORDER — TRANEXAMIC ACID 1000 MG/10ML IV SOLN
2000.0000 mg | INTRAVENOUS | Status: AC
  Filled 2017-11-10: qty 20

## 2017-11-10 MED ORDER — DEXAMETHASONE SODIUM PHOSPHATE 10 MG/ML IJ SOLN
INTRAMUSCULAR | Status: AC
  Filled 2017-11-10: qty 1

## 2017-11-10 MED ORDER — PHENOL 1.4 % MT LIQD: 1.0000 | OROMUCOSAL | Status: DC | PRN

## 2017-11-10 MED ORDER — TRANEXAMIC ACID 1000 MG/10ML IV SOLN
2000.0000 mg | Freq: Once | INTRAVENOUS | Status: DC
  Filled 2017-11-10: qty 20

## 2017-11-10 MED ORDER — METHOCARBAMOL 500 MG PO TABS
ORAL_TABLET | ORAL | Status: AC
  Filled 2017-11-10: qty 1

## 2017-11-10 MED ORDER — LACTATED RINGERS IV SOLN: INTRAVENOUS | Status: DC

## 2017-11-10 MED ORDER — CEFAZOLIN SODIUM-DEXTROSE 1-4 GM/50ML-% IV SOLN
1.0000 g | Freq: Four times a day (QID) | INTRAVENOUS | Status: AC
  Administered 2017-11-10 (×2): 1 g via INTRAVENOUS
  Filled 2017-11-10 (×2): qty 50

## 2017-11-10 MED ORDER — METHOCARBAMOL 500 MG PO TABS
500.0000 mg | ORAL_TABLET | Freq: Four times a day (QID) | ORAL | Status: DC | PRN
  Administered 2017-11-10: 500 mg via ORAL
  Filled 2017-11-10: qty 1

## 2017-11-10 MED ORDER — ACETAMINOPHEN 325 MG PO TABS: 650.0000 mg | ORAL_TABLET | ORAL | Status: DC | PRN

## 2017-11-10 MED ORDER — HYDROMORPHONE HCL 1 MG/ML IJ SOLN
0.2500 mg | INTRAMUSCULAR | Status: DC | PRN
  Administered 2017-11-10 (×2): 0.5 mg via INTRAVENOUS

## 2017-11-10 MED ORDER — FLEET ENEMA 7-19 GM/118ML RE ENEM: 1.0000 | ENEMA | Freq: Once | RECTAL | Status: DC | PRN

## 2017-11-10 MED ORDER — PROPOFOL 10 MG/ML IV BOLUS
INTRAVENOUS | Status: DC | PRN
Start: 2017-11-10 — End: 2017-11-10
  Administered 2017-11-10: 160 mg via INTRAVENOUS

## 2017-11-10 MED ORDER — TRANEXAMIC ACID 1000 MG/10ML IV SOLN
1000.0000 mg | INTRAVENOUS | Status: AC
  Administered 2017-11-10: 1000 mg via INTRAVENOUS
  Filled 2017-11-10: qty 10

## 2017-11-10 MED ORDER — MENTHOL 3 MG MT LOZG: 1.0000 | LOZENGE | OROMUCOSAL | Status: DC | PRN

## 2017-11-10 MED ORDER — LIDOCAINE 2% (20 MG/ML) 5 ML SYRINGE
INTRAMUSCULAR | Status: AC
  Filled 2017-11-10: qty 5

## 2017-11-10 MED ORDER — DEXAMETHASONE SODIUM PHOSPHATE 10 MG/ML IJ SOLN
INTRAMUSCULAR | Status: DC | PRN
  Administered 2017-11-10: 10 mg via INTRAVENOUS

## 2017-11-10 MED ORDER — ONDANSETRON HCL 4 MG/2ML IJ SOLN
INTRAMUSCULAR | Status: DC | PRN
  Administered 2017-11-10: 4 mg via INTRAVENOUS

## 2017-11-10 MED ORDER — FENTANYL CITRATE (PF) 250 MCG/5ML IJ SOLN
INTRAMUSCULAR | Status: AC
  Filled 2017-11-10: qty 5

## 2017-11-10 MED ORDER — CHLORHEXIDINE GLUCONATE 4 % EX LIQD: 60.0000 mL | Freq: Once | CUTANEOUS | Status: DC

## 2017-11-10 MED ORDER — LACTATED RINGERS IV SOLN
INTRAVENOUS | Status: DC
  Administered 2017-11-10: 11:00:00 via INTRAVENOUS

## 2017-11-10 MED ORDER — EPHEDRINE SULFATE 50 MG/ML IJ SOLN
INTRAMUSCULAR | Status: AC
  Filled 2017-11-10: qty 1

## 2017-11-10 MED ORDER — CELECOXIB 200 MG PO CAPS
200.0000 mg | ORAL_CAPSULE | Freq: Two times a day (BID) | ORAL | Status: DC
  Administered 2017-11-10 – 2017-11-11 (×3): 200 mg via ORAL
  Filled 2017-11-10 (×3): qty 1

## 2017-11-10 MED ORDER — SUGAMMADEX SODIUM 200 MG/2ML IV SOLN
INTRAVENOUS | Status: DC | PRN
  Administered 2017-11-10: 100 mg via INTRAVENOUS

## 2017-11-10 MED ORDER — MIDAZOLAM HCL 2 MG/2ML IJ SOLN
INTRAMUSCULAR | Status: DC | PRN
Start: 2017-11-10 — End: 2017-11-10
  Administered 2017-11-10: 2 mg via INTRAVENOUS

## 2017-11-10 MED ORDER — METOCLOPRAMIDE HCL 5 MG/ML IJ SOLN: 5.0000 mg | Freq: Three times a day (TID) | INTRAMUSCULAR | Status: DC | PRN

## 2017-11-10 MED ORDER — KETOROLAC TROMETHAMINE 30 MG/ML IJ SOLN
INTRAMUSCULAR | Status: DC | PRN
  Administered 2017-11-10: 30 mg via INTRAVENOUS

## 2017-11-10 MED ORDER — DEXAMETHASONE SODIUM PHOSPHATE 10 MG/ML IJ SOLN
10.0000 mg | Freq: Once | INTRAMUSCULAR | Status: AC
  Administered 2017-11-11: 10 mg via INTRAVENOUS
  Filled 2017-11-10: qty 1

## 2017-11-10 MED ORDER — ONDANSETRON HCL 4 MG/2ML IJ SOLN: 4.0000 mg | Freq: Four times a day (QID) | INTRAMUSCULAR | Status: DC | PRN

## 2017-11-10 MED ORDER — HYDROCODONE-ACETAMINOPHEN 5-325 MG PO TABS: 1.0000 | ORAL_TABLET | ORAL | 0 refills | Status: DC | PRN

## 2017-11-10 MED ORDER — 0.9 % SODIUM CHLORIDE (POUR BTL) OPTIME
TOPICAL | Status: DC | PRN
  Administered 2017-11-10: 1000 mL

## 2017-11-10 MED ORDER — ASPIRIN EC 325 MG PO TBEC: 325.0000 mg | DELAYED_RELEASE_TABLET | Freq: Every day | ORAL | 0 refills | Status: DC

## 2017-11-10 MED ORDER — ASPIRIN EC 325 MG PO TBEC
325.0000 mg | DELAYED_RELEASE_TABLET | Freq: Every day | ORAL | Status: DC
  Administered 2017-11-11: 325 mg via ORAL
  Filled 2017-11-10: qty 1

## 2017-11-10 MED ORDER — OMEPRAZOLE 20 MG PO CPDR: 20.0000 mg | DELAYED_RELEASE_CAPSULE | Freq: Every day | ORAL | 0 refills | Status: DC

## 2017-11-10 MED ORDER — CEFAZOLIN SODIUM-DEXTROSE 2-3 GM-%(50ML) IV SOLR
INTRAVENOUS | Status: DC | PRN
  Administered 2017-11-10: 2 g via INTRAVENOUS

## 2017-11-10 MED ORDER — PHENYLEPHRINE 40 MCG/ML (10ML) SYRINGE FOR IV PUSH (FOR BLOOD PRESSURE SUPPORT)
PREFILLED_SYRINGE | INTRAVENOUS | Status: AC
  Filled 2017-11-10: qty 10

## 2017-11-10 MED ORDER — KETOROLAC TROMETHAMINE 30 MG/ML IJ SOLN
INTRAMUSCULAR | Status: AC
  Filled 2017-11-10: qty 1

## 2017-11-10 MED ORDER — ACETAMINOPHEN 500 MG PO TABS
1000.0000 mg | ORAL_TABLET | Freq: Once | ORAL | Status: AC
  Administered 2017-11-10: 1000 mg via ORAL
  Filled 2017-11-10: qty 2

## 2017-11-10 MED ORDER — METOCLOPRAMIDE HCL 5 MG PO TABS: 5.0000 mg | ORAL_TABLET | Freq: Three times a day (TID) | ORAL | Status: DC | PRN

## 2017-11-10 MED ORDER — CEFAZOLIN SODIUM-DEXTROSE 2-4 GM/100ML-% IV SOLN
2.0000 g | INTRAVENOUS | Status: AC
  Filled 2017-11-10: qty 100

## 2017-11-10 MED ORDER — SENNA 8.6 MG PO TABS
1.0000 | ORAL_TABLET | Freq: Two times a day (BID) | ORAL | Status: DC
  Administered 2017-11-10 – 2017-11-11 (×2): 8.6 mg via ORAL
  Filled 2017-11-10 (×2): qty 1

## 2017-11-10 MED ORDER — BUPIVACAINE HCL (PF) 0.25 % IJ SOLN
INTRAMUSCULAR | Status: AC
  Filled 2017-11-10: qty 30

## 2017-11-10 MED ORDER — CELECOXIB 200 MG PO CAPS: 200.0000 mg | ORAL_CAPSULE | Freq: Two times a day (BID) | ORAL | 0 refills | Status: AC

## 2017-11-10 MED ORDER — MIDAZOLAM HCL 2 MG/2ML IJ SOLN
INTRAMUSCULAR | Status: AC
  Filled 2017-11-10: qty 2

## 2017-11-10 MED ORDER — TRANEXAMIC ACID 1000 MG/10ML IV SOLN
1000.0000 mg | INTRAVENOUS | Status: AC
  Filled 2017-11-10: qty 10

## 2017-11-10 MED ORDER — PROMETHAZINE HCL 25 MG/ML IJ SOLN: 6.2500 mg | INTRAMUSCULAR | Status: DC | PRN

## 2017-11-10 MED ORDER — BUPIVACAINE-EPINEPHRINE 0.25% -1:200000 IJ SOLN
INTRAMUSCULAR | Status: DC | PRN
  Administered 2017-11-10: 30 mL

## 2017-11-10 MED ORDER — ROCURONIUM BROMIDE 10 MG/ML (PF) SYRINGE
PREFILLED_SYRINGE | INTRAVENOUS | Status: DC | PRN
  Administered 2017-11-10: 20 mg via INTRAVENOUS
  Administered 2017-11-10: 60 mg via INTRAVENOUS
  Administered 2017-11-10 (×3): 20 mg via INTRAVENOUS

## 2017-11-10 MED ORDER — HYDROMORPHONE HCL 1 MG/ML IJ SOLN
INTRAMUSCULAR | Status: AC
  Administered 2017-11-10: 0.5 mg via INTRAVENOUS
  Filled 2017-11-10: qty 1

## 2017-11-10 MED ORDER — ROCURONIUM BROMIDE 10 MG/ML (PF) SYRINGE
PREFILLED_SYRINGE | INTRAVENOUS | Status: AC
  Filled 2017-11-10: qty 5

## 2017-11-10 MED ORDER — METHOCARBAMOL 1000 MG/10ML IJ SOLN
500.0000 mg | Freq: Four times a day (QID) | INTRAVENOUS | Status: DC | PRN
  Filled 2017-11-10: qty 5

## 2017-11-10 SURGICAL SUPPLY — 57 items
BAG DECANTER FOR FLEXI CONT (MISCELLANEOUS) ×2 IMPLANT
BLADE SAG 18X100X1.27 (BLADE) ×2 IMPLANT
CAPT HIP TOTAL 3 ×2 IMPLANT
CLOSURE STERI-STRIP 1/2X4 (GAUZE/BANDAGES/DRESSINGS) ×1
CLSR STERI-STRIP ANTIMIC 1/2X4 (GAUZE/BANDAGES/DRESSINGS) ×2 IMPLANT
COVER PERINEAL POST (MISCELLANEOUS) ×3 IMPLANT
COVER SURGICAL LIGHT HANDLE (MISCELLANEOUS) ×3 IMPLANT
DECANTER SPIKE VIAL GLASS SM (MISCELLANEOUS) ×2 IMPLANT
DRAPE C-ARM 42X72 X-RAY (DRAPES) ×3 IMPLANT
DRAPE STERI IOBAN 125X83 (DRAPES) ×3 IMPLANT
DRAPE U-SHAPE 47X51 STRL (DRAPES) IMPLANT
DRSG MEPILEX BORDER 4X8 (GAUZE/BANDAGES/DRESSINGS) ×3 IMPLANT
DURAPREP 26ML APPLICATOR (WOUND CARE) ×3 IMPLANT
ELECT BLADE 4.0 EZ CLEAN MEGAD (MISCELLANEOUS) ×3
ELECT REM PT RETURN 9FT ADLT (ELECTROSURGICAL) ×3
ELECTRODE BLDE 4.0 EZ CLN MEGD (MISCELLANEOUS) ×1 IMPLANT
ELECTRODE REM PT RTRN 9FT ADLT (ELECTROSURGICAL) ×1 IMPLANT
FACESHIELD WRAPAROUND (MASK) ×6 IMPLANT
FACESHIELD WRAPAROUND OR TEAM (MASK) ×2 IMPLANT
GLOVE BIO SURGEON STRL SZ7.5 (GLOVE) ×6 IMPLANT
GLOVE BIOGEL PI IND STRL 8 (GLOVE) ×2 IMPLANT
GLOVE BIOGEL PI INDICATOR 8 (GLOVE) ×4
GOWN STRL REUS W/ TWL LRG LVL3 (GOWN DISPOSABLE) ×2 IMPLANT
GOWN STRL REUS W/TWL LRG LVL3 (GOWN DISPOSABLE) ×6
KIT BASIN OR (CUSTOM PROCEDURE TRAY) ×3 IMPLANT
KIT ROOM TURNOVER OR (KITS) ×3 IMPLANT
MANIFOLD NEPTUNE II (INSTRUMENTS) ×3 IMPLANT
NDL 18GX1X1/2 (RX/OR ONLY) (NEEDLE) IMPLANT
NDL SAFETY ECLIPSE 18X1.5 (NEEDLE) IMPLANT
NDL SPNL 18GX3.5 QUINCKE PK (NEEDLE) IMPLANT
NEEDLE 18GX1X1/2 (RX/OR ONLY) (NEEDLE) ×3 IMPLANT
NEEDLE 22X1 1/2 (OR ONLY) (NEEDLE) ×2 IMPLANT
NEEDLE HYPO 18GX1.5 SHARP (NEEDLE)
NEEDLE HYPO 22GX1.5 SAFETY (NEEDLE) ×3 IMPLANT
NEEDLE SPNL 18GX3.5 QUINCKE PK (NEEDLE) ×6 IMPLANT
NS IRRIG 1000ML POUR BTL (IV SOLUTION) ×3 IMPLANT
PACK TOTAL JOINT (CUSTOM PROCEDURE TRAY) ×3 IMPLANT
PAD ARMBOARD 7.5X6 YLW CONV (MISCELLANEOUS) ×3 IMPLANT
SPONGE LAP 18X18 X RAY DECT (DISPOSABLE) IMPLANT
SUT MNCRL AB 4-0 PS2 18 (SUTURE) ×5 IMPLANT
SUT MON AB 2-0 CT1 36 (SUTURE) ×7 IMPLANT
SUT VIC AB 0 CT1 27 (SUTURE) ×9
SUT VIC AB 0 CT1 27XBRD ANBCTR (SUTURE) ×1 IMPLANT
SUT VIC AB 1 CT1 27 (SUTURE) ×3
SUT VIC AB 1 CT1 27XBRD ANBCTR (SUTURE) ×1 IMPLANT
SUT VLOC 180 0 24IN GS25 (SUTURE) ×2 IMPLANT
SYR 3ML 25GX5/8 SAFETY (SYRINGE) ×2 IMPLANT
SYR 50ML LL SCALE MARK (SYRINGE) ×3 IMPLANT
SYR BULB IRRIGATION 50ML (SYRINGE) ×3 IMPLANT
SYRINGE 20CC LL (MISCELLANEOUS) IMPLANT
SYRINGE 60CC LL (MISCELLANEOUS) ×1 IMPLANT
TOWEL OR 17X24 6PK STRL BLUE (TOWEL DISPOSABLE) ×3 IMPLANT
TOWEL OR 17X26 10 PK STRL BLUE (TOWEL DISPOSABLE) ×3 IMPLANT
TRAY CATH 16FR W/PLASTIC CATH (SET/KITS/TRAYS/PACK) IMPLANT
TRAY FOLEY W/METER SILVER 16FR (SET/KITS/TRAYS/PACK) IMPLANT
WATER STERILE IRR 1000ML POUR (IV SOLUTION) ×3 IMPLANT
YANKAUER SUCT BULB TIP NO VENT (SUCTIONS) ×6 IMPLANT

## 2017-11-10 NOTE — Interval H&P Note (Signed)
History and Physical Interval Note:  11/10/2017 7:36 AM  Ryan Foley  has presented today for surgery, with the diagnosis of hip fracture  The various methods of treatment have been discussed with the patient and family. After consideration of risks, benefits and other options for treatment, the patient has consented to  Procedure(s): TOTAL HIP ARTHROPLASTY ANTERIOR APPROACH (Right) as a surgical intervention .  The patient's history has been reviewed, patient examined, no change in status, stable for surgery.  I have reviewed the patient's chart and labs.  Questions were answered to the patient's satisfaction.     Wendle Kina D

## 2017-11-10 NOTE — Anesthesia Postprocedure Evaluation (Signed)
Anesthesia Post Note  Patient: Ryan Foley  Procedure(s) Performed: TOTAL HIP ARTHROPLASTY ANTERIOR APPROACH (Right Hip)     Patient location during evaluation: PACU Anesthesia Type: General Level of consciousness: awake and alert Pain management: pain level controlled Vital Signs Assessment: post-procedure vital signs reviewed and stable Respiratory status: spontaneous breathing, nonlabored ventilation, respiratory function stable and patient connected to nasal cannula oxygen Cardiovascular status: blood pressure returned to baseline and stable Postop Assessment: no apparent nausea or vomiting Anesthetic complications: no    Last Vitals:  Vitals:   11/10/17 0957 11/10/17 1000  BP:  130/85  Pulse: 73 69  Resp: 11 18  Temp: 36.7 C   SpO2: 96% 92%    Last Pain:  Vitals:   11/10/17 0957  TempSrc:   PainSc: 6                  Cymone Yeske S
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
## 2017-11-10 NOTE — Progress Notes (Signed)
OT Cancellation Note  Patient Details Name: Vaughan BastaMichael F Correa MRN: [id number] DOB: 1961-09-23   Cancelled Treatment:    Reason Eval/Treat Not Completed: Patient at procedure or test/ unavailable(currently in OR)  Gaye AlkenBailey A Keegan Bensch M.S., OTR/L Pager: [phone]  11/10/2017, 8:11 AM

## 2017-11-10 NOTE — Transfer of Care (Signed)
Immediate Anesthesia Transfer of Care Note  Patient: Ryan Foley  Procedure(s) Performed: TOTAL HIP ARTHROPLASTY ANTERIOR APPROACH (Right Hip)  Patient Location: PACU  Anesthesia Type:General  Level of Consciousness: awake, alert , oriented and patient cooperative  Airway & Oxygen Therapy: Patient Spontanous Breathing and Patient connected to nasal cannula oxygen  Post-op Assessment: Report given to RN, Post -op Vital signs reviewed and stable and Patient moving all extremities X 4  Post vital signs: Reviewed and stable  Last Vitals:  Vitals:   11/09/17 2019 11/10/17 0445  BP: 122/68 123/75  Pulse: 65 (!) 53  Resp: 18 18  Temp: 37.1 C 37 C  SpO2: 99% 96%    Last Pain:  Vitals:   11/10/17 0604  TempSrc:   PainSc: 0-No pain         Complications: No apparent anesthesia complications

## 2017-11-10 NOTE — Progress Notes (Signed)
PT Cancellation Note  Patient Details Name: Ryan Foley MRN: [id number] DOB: [birth year]   Cancelled Treatment:    Reason Eval/Treat Not Completed: Other (comment)   Noted plans for surgical fixation of hip today;   Will follow up post-op -- later today as time allows;  Otherwise, will follow up for PT eval tomorrow;   Thank you,  Van ClinesHolly Charrisse Masley, PT  Acute Rehabilitation Services Pager [phone] Office [phone]     Ryan Foley 11/10/2017, 7:20 AM

## 2017-11-10 NOTE — Discharge Instructions (Signed)

## 2017-11-10 NOTE — Progress Notes (Signed)
Attempt to call report.

## 2017-11-10 NOTE — Op Note (Signed)
11/09/2017 - 11/10/2017  9:18 AM  PATIENT:  Ryan Foley   MRN: [id number]  PRE-OPERATIVE DIAGNOSIS:  hip fracture  POST-OPERATIVE DIAGNOSIS:  hip fracture  PROCEDURE:  Procedure(s): TOTAL HIP ARTHROPLASTY ANTERIOR APPROACH  PREOPERATIVE INDICATIONS:    Ryan Foley is an 56 y.o. male who has a diagnosis of <principal problem not specified> and elected for surgical management after failing conservative treatment.  The risks benefits and alternatives were discussed with the patient including but not limited to the risks of nonoperative treatment, versus surgical intervention including infection, bleeding, nerve injury, periprosthetic fracture, the need for revision surgery, dislocation, leg length discrepancy, blood clots, cardiopulmonary complications, morbidity, mortality, among others, and they were willing to proceed.     OPERATIVE REPORT     SURGEON:   Shelbia Scinto, Ernesta Amble, MD    ASSISTANT:  Roxan Hockey, PA-C, he was present and scrubbed throughout the case, critical for completion in a timely fashion, and for retraction, instrumentation, and closure.     ANESTHESIA:  General    COMPLICATIONS:  None.     COMPONENTS:  Stryker acolade fit femur size 7 with a 36 mm -2.5 head ball and a PSL acetabular shell size 56 with a  polyethylene liner    PROCEDURE IN DETAIL:   The patient was met in the holding area and  identified.  The appropriate hip was identified and marked at the operative site.  The patient was then transported to the OR  and  placed under anesthesia per that record.  At that point, the patient was  placed in the supine position and  secured to the operating room table and all bony prominences padded. He received pre-operative antibiotics    The operative lower extremity was prepped from the iliac crest to the distal leg.  Sterile draping was performed.  Time out was performed prior to incision.      Skin incision was made just 2 cm lateral to the  ASIS  extending in line with the tensor fascia lata. Electrocautery was used to control all bleeders. I dissected down sharply to the fascia of the tensor fascia lata was confirmed that the muscle fibers beneath were running posteriorly. I then incised the fascia over the superficial tensor fascia lata in line with the incision. The fascia was elevated off the anterior aspect of the muscle the muscle was retracted posteriorly and protected throughout the case. I then used electrocautery to incise the tensor fascia lata fascia control and all bleeders. Immediately visible was the fat over top of the anterior neck and capsule.  I removed the anterior fat from the capsule and elevated the rectus muscle off of the anterior capsule. I then removed a large time of capsule. The retractors were then placed over the anterior acetabulum as well as around the superior and inferior neck.  I then removed a section of the femoral neck and a napkin ring fashion. Then used the power course to remove the femoral head from the acetabulum and thoroughly irrigated the acetabulum. I sized the femoral head.    I then exposed the deep acetabulum, cleared out any tissue including the ligamentum teres.   After adequate visualization, I excised the labrum, and then sequentially reamed.  I then impacted the acetabular implant into place using fluoroscopy for guidance.  Appropriate version and inclination was confirmed clinically matching their bony anatomy, and with fluoroscopy.  I placed a 20 mm screw in the posterior/superio position with an excellent bite.  I then placed the polyethylene liner in place  I then adducted the leg and released the external rotators from the posterior femur allowing it to be easily delivered up lateral and anterior to the acetabulum for preparation of the femoral canal.    I then prepared the proximal femur using the cookie-cutter and then sequentially reamed and broached.  A trial broach,  neck, and head was utilized, and I reduced the hip and used floroscopy to assess the neck length and femoral implant.  I then impacted the femoral prosthesis into place into the appropriate version. The hip was then reduced and fluoroscopy confirmed appropriate position. Leg lengths were restored.  I then irrigated the hip copiously again with, and repaired the fascia with Vicryl, followed by monocryl for the subcutaneous tissue, Monocryl for the skin, Steri-Strips and sterile gauze. The patient was then awakened and returned to PACU in stable and satisfactory condition. There were no complications.  POST OPERATIVE PLAN: WBAT, DVT px: SCD's/TED, ambulation and chemical dvt px  Ashby Leflore, MD Orthopedic Surgeon [phone]     

## 2017-11-10 NOTE — Anesthesia Preprocedure Evaluation (Signed)
Anesthesia Evaluation  Patient identified by MRN, date of birth, ID band Patient awake    Reviewed: Allergy & Precautions, NPO status , Patient's Chart, lab work & pertinent test results  Airway Mallampati: II  TM Distance: >3 FB Neck ROM: Full    Dental no notable dental hx.    Pulmonary neg pulmonary ROS,    Pulmonary exam normal breath sounds clear to auscultation       Cardiovascular negative cardio ROS Normal cardiovascular exam Rhythm:Regular Rate:Normal     Neuro/Psych negative neurological ROS  negative psych ROS   GI/Hepatic negative GI ROS, Neg liver ROS,   Endo/Other  negative endocrine ROS  Renal/GU negative Renal ROS  negative genitourinary   Musculoskeletal negative musculoskeletal ROS (+)   Abdominal   Peds negative pediatric ROS (+)  Hematology negative hematology ROS (+)   Anesthesia Other Findings   Reproductive/Obstetrics negative OB ROS                             Anesthesia Physical Anesthesia Plan  ASA: I and emergent  Anesthesia Plan: General   Post-op Pain Management:    Induction: Intravenous  PONV Risk Score and Plan: 2 and Ondansetron, Dexamethasone and Treatment may vary due to age or medical condition  Airway Management Planned: Oral ETT  Additional Equipment:   Intra-op Plan:   Post-operative Plan: Extubation in OR  Informed Consent: I have reviewed the patients History and Physical, chart, labs and discussed the procedure including the risks, benefits and alternatives for the proposed anesthesia with the patient or authorized representative who has indicated his/her understanding and acceptance.   Dental advisory given  Plan Discussed with: CRNA and Surgeon  Anesthesia Plan Comments:         Anesthesia Quick Evaluation
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
## 2017-11-10 NOTE — Anesthesia Procedure Notes (Signed)
Procedure Name: Intubation Date/Time: 11/10/2017 7:51 AM Performed by: Julieta Bellini, CRNA Pre-anesthesia Checklist: Emergency Drugs available, Patient identified, Suction available and Patient being monitored Patient Re-evaluated:Patient Re-evaluated prior to induction Oxygen Delivery Method: Circle system utilized Preoxygenation: Pre-oxygenation with 100% oxygen Induction Type: IV induction Ventilation: Mask ventilation without difficulty Laryngoscope Size: Mac and 4 Grade View: Grade I Tube type: Oral Tube size: 7.5 mm Number of attempts: 1 Airway Equipment and Method: Stylet Placement Confirmation: ETT inserted through vocal cords under direct vision,  positive ETCO2 and breath sounds checked- equal and bilateral Secured at: 23 cm Tube secured with: Tape Dental Injury: Teeth and Oropharynx as per pre-operative assessment

## 2017-11-11 ENCOUNTER — Encounter (HOSPITAL_COMMUNITY): Payer: Self-pay | Admitting: Orthopedic Surgery

## 2017-11-11 DIAGNOSIS — S72001A Fracture of unspecified part of neck of right femur, initial encounter for closed fracture: Secondary | ICD-10-CM | POA: Diagnosis present

## 2017-11-11 LAB — CBC
HCT: 35.2 % — ABNORMAL LOW (ref 39.0–52.0)
HEMOGLOBIN: 11.8 g/dL — AB (ref 13.0–17.0)
MCH: 31.9 pg (ref 26.0–34.0)
MCHC: 33.5 g/dL (ref 30.0–36.0)
MCV: 95.1 fL (ref 78.0–100.0)
Platelets: 195 10*3/uL (ref 150–400)
RBC: 3.7 MIL/uL — ABNORMAL LOW (ref 4.22–5.81)
RDW: 12.9 % (ref 11.5–15.5)
WBC: 9 10*3/uL (ref 4.0–10.5)

## 2017-11-11 LAB — BASIC METABOLIC PANEL
Anion gap: 9 (ref 5–15)
BUN: 13 mg/dL (ref 6–20)
CALCIUM: 8.4 mg/dL — AB (ref 8.9–10.3)
CHLORIDE: 99 mmol/L — AB (ref 101–111)
CO2: 28 mmol/L (ref 22–32)
CREATININE: 0.98 mg/dL (ref 0.61–1.24)
GFR calc non Af Amer: >60 mL/min (ref >60)
Glucose, Bld: 107 mg/dL — ABNORMAL HIGH (ref 65–99)
Potassium: 3.7 mmol/L (ref 3.5–5.1)
SODIUM: 136 mmol/L (ref 135–145)

## 2017-11-11 LAB — MAGNESIUM: MAGNESIUM: 1.9 mg/dL (ref 1.7–2.4)

## 2017-11-11 NOTE — Care Management Note (Signed)
Case Management Note  Patient Details  Name: Ryan Foley MRN: [id number] Date of Birth: 1961/04/16  Subjective/Objective:   56 yr old gentleman s/p right hip fracture, underwent a right Total Hip arthroplasty.                Action/Plan: Case manager spoke with patient concerning discharge plan and DME. Patient will go to outpatient therapy if needed, CM has requested DME for home. He will have family support at discharge.    Expected Discharge Date:  11/11/17               Expected Discharge Plan:   Home/Self Care  In-House Referral:     Discharge planning Services  CM Consult  Post Acute Care Choice:  Durable Medical Equipment Choice offered to:  Patient  DME Arranged:  3-N-1, Walker rolling DME Agency:  [facility]  HH Arranged:  NA HH Agency:  NA  Status of Service:  Completed, signed off  If discussed at Long Length of Stay Meetings, dates discussed:    Additional Comments:  Durenda GuthrieBrady, Monzerrath Mcburney Naomi, RN 11/11/2017, 11:31 AM
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
## 2017-11-11 NOTE — Progress Notes (Signed)
   Assessment / Plan: 1 Day Post-Op  S/P Procedure(s) (LRB): TOTAL HIP ARTHROPLASTY ANTERIOR APPROACH (Right) by Dr. Jewel Baizeimothy D. Murphy on 11/10/17  Principal Problem:   Fracture of femoral neck, right (HCC)   Right Femoral Neck Fracture Doing very well POD1 Eating, drinking, voiding, mobilizing.  Pain minimal.  Up with therapy D/C IV fluids Incentive Spirometry Elevate and apply ice  Weight Bearing: Weight Bearing as Tolerated (WBAT)  Dressings: Maintain Mepilex.  VTE prophylaxis: Aspirin, SCDs, ambulation Dispo: Home today after therapy works on stair negotiation   Subjective: Patient reports pain as mild.  Tolerating diet.  Urinating.  +Flatus.  No CP, SOB.  OOB mobilizing well.  Objective:   VITALS:   Vitals:   11/10/17 1700 11/10/17 2010 11/11/17 0030 11/11/17 0500  BP: 105/67 101/64 (!) 106/59 111/71  Pulse: (!) 57 (!) 58 (!) 57 (!) 56  Resp: 18 18 18 18   Temp: 98.4 F (36.9 C) 98.7 F (37.1 C) 98.2 F (36.8 C) 98.5 F (36.9 C)  TempSrc: Oral Oral Oral Oral  SpO2: 94% 95% 95% 98%  Weight:      Height:       CBC Latest Ref Rng & Units 11/10/2017 11/09/2017 04/03/2017  WBC 4.0 - 10.5 K/uL 8.0 6.6 5.3  Hemoglobin 13.0 - 17.0 g/dL 16.113.2 09.614.0 04.513.8  Hematocrit 39.0 - 52.0 % 40.0 42.2 40.5  Platelets 150 - 400 K/uL 215 201 208   BMP Latest Ref Rng & Units 11/10/2017 11/09/2017 04/03/2017  Glucose 65 - 99 mg/dL 409(W111(H) 119(J100(H) 99  BUN 6 - 20 mg/dL 10 15 47(W21(H)  Creatinine 0.61 - 1.24 mg/dL 2.951.03 6.211.08 3.080.98  Sodium 135 - 145 mmol/L 135 136 138  Potassium 3.5 - 5.1 mmol/L 3.9 3.8 3.9  Chloride 101 - 111 mmol/L 102 104 106  CO2 22 - 32 mmol/L 26 27 23   Calcium 8.9 - 10.3 mg/dL 6.5(H8.4(L) 8.9 9.0   Intake/Output      12/30 0701 - 12/31 0700   I.V. (mL/kg) 2591.7 (27.2)   IV Piggyback 50   Total Intake(mL/kg) 2641.7 (27.7)   Urine (mL/kg/hr) 400 (0.2)   Blood 300   Total Output 700   Net +1941.7       Urine Occurrence 2 x      Physical Exam: General: NAD.   Upright in bed.  Calm, conversant. Resp: No increased wob Cardio: regular rate and rhythm ABD soft Neurologically intact MSK Neurovascularly intact Sensation intact distally Intact pulses distally Dorsiflexion/Plantar flexion intact Incision: dressing C/D/I   Albina BilletHenry Calvin Martensen III, PA-C 11/11/2017, 6:42 AM
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
## 2017-11-11 NOTE — Discharge Summary (Signed)
Discharge Summary  Patient ID: Ryan Foley MRN: [id number] DOB/AGE: 1961-06-16 56 y.o.  Admit date: 11/09/2017 Discharge date: 11/11/2017  Admission Diagnoses:  Fracture of femoral neck, right [facility](HCC)  Discharge Diagnoses:  Principal Problem:   Fracture of femoral neck, right [facility](HCC)   Past Medical History:  Diagnosis Date  . Hemorrhoid   . Medical history non-contributory   . No pertinent past medical history     Surgeries: Procedure(s): TOTAL HIP ARTHROPLASTY ANTERIOR APPROACH on 11/10/2017   Consultants (if any): Treatment Team:  Sheral ApleyMurphy, Timothy D, MD  Discharged Condition: Improved  Hospital Course: Ryan Foley is an 56 y.o. male who was admitted 11/09/2017 with a diagnosis of Fracture of femoral neck, right (HCC) and went to the operating room on 11/10/2017 and underwent the above named procedures.    He was given perioperative antibiotics:  Anti-infectives (From admission, onward)   Start     Dose/Rate Route Frequency Ordered Stop   11/10/17 1115  ceFAZolin (ANCEF) IVPB 2g/100 mL premix     2 g 200 mL/hr over 30 Minutes Intravenous On call to O.R. 11/10/17 1102 11/11/17 0559   11/10/17 1115  ceFAZolin (ANCEF) IVPB 1 g/50 mL premix     1 g 100 mL/hr over 30 Minutes Intravenous Every 6 hours 11/10/17 1102 11/10/17 1848    .  He was given sequential compression devices, early ambulation, and ASA 325 mg for 30 days for DVT prophylaxis.  He benefited maximally from the hospital stay and there were no complications.    Recent vital signs:  Vitals:   11/11/17 0030 11/11/17 0500  BP: (!) 106/59 111/71  Pulse: (!) 57 (!) 56  Resp: 18 18  Temp: 98.2 F (36.8 C) 98.5 F (36.9 C)  SpO2: 95% 98%    Recent laboratory studies:  Lab Results  Component Value Date   HGB 13.2 11/10/2017   HGB 14.0 11/09/2017   HGB 13.8 04/03/2017   Lab Results  Component Value Date   WBC 8.0 11/10/2017   PLT 215 11/10/2017   No results found for: INR Lab Results   Component Value Date   NA 135 11/10/2017   K 3.9 11/10/2017   CL 102 11/10/2017   CO2 26 11/10/2017   BUN 10 11/10/2017   CREATININE 1.03 11/10/2017   GLUCOSE 111 (H) 11/10/2017    Discharge Medications:   Allergies as of 11/11/2017   No Known Allergies     Medication List    STOP taking these medications   amoxicillin-clavulanate 875-125 MG tablet Commonly known as:  AUGMENTIN   benzonatate 100 MG capsule Commonly known as:  TESSALON   cyclobenzaprine 10 MG tablet Commonly known as:  FLEXERIL   ZYLET 0.5-0.3 % Susp Generic drug:  Loteprednol-Tobramycin     TAKE these medications   aspirin EC 325 MG tablet Take 1 tablet (325 mg total) by mouth daily. For 30 days post op for DVT Prophylaxis   celecoxib 200 MG capsule Commonly known as:  CELEBREX Take 1 capsule (200 mg total) by mouth 2 (two) times daily. For 2 weeks post op for pain and inflammation.  Discontinue Ibuprofen or other Anti-inflammatory medicine when taking this medicine.   cetirizine 10 MG tablet Commonly known as:  ZYRTEC Take 10 mg by mouth daily as needed for allergies.   docusate sodium 100 MG capsule Commonly known as:  COLACE Take 1 capsule (100 mg total) by mouth 2 (two) times daily. To prevent constipation while taking pain medication.  HYDROcodone-acetaminophen 5-325 MG tablet Commonly known as:  NORCO Take 1-2 tablets by mouth every 4 (four) hours as needed for moderate pain.   methocarbamol 500 MG tablet Commonly known as:  ROBAXIN Take 1 tablet (500 mg total) by mouth every 6 (six) hours as needed for muscle spasms.   omeprazole 20 MG capsule Commonly known as:  PRILOSEC Take 1 capsule (20 mg total) by mouth daily. 30 days for gastroprotection while taking Aspirin.   ondansetron 4 MG tablet Commonly known as:  ZOFRAN Take 1 tablet (4 mg total) by mouth every 8 (eight) hours as needed for nausea or vomiting.       Diagnostic Studies: Dg Chest Port 1 View  Result Date:  11/09/2017 CLINICAL DATA:  Preop evaluation EXAM: PORTABLE CHEST 1 VIEW COMPARISON:  04/03/2017 FINDINGS: The heart size and mediastinal contours are within normal limits. Both lungs are clear. The visualized skeletal structures are unremarkable. IMPRESSION: No active disease. Electronically Signed   By: Marlan Palauharles  Clark M.D.   On: 11/09/2017 19:28   Dg C-arm 1-60 Min  Result Date: 11/10/2017 CLINICAL DATA:  Hip fracture EXAM: DG C-ARM 61-120 MIN; OPERATIVE RIGHT HIP WITH PELVIS COMPARISON:  11/09/2017 FINDINGS: Right total hip arthroplasty has been placed. There is anatomic alignment of the osseous and prostatic structures. No breakage or loosening of the hardware. No acute fracture. IMPRESSION: Right total hip arthroplasty placement anatomically aligned. Electronically Signed   By: Jolaine ClickArthur  Hoss M.D.   On: 11/10/2017 10:30   Dg Hip Operative Unilat W Or W/o Pelvis Right  Result Date: 11/10/2017 CLINICAL DATA:  Hip fracture EXAM: DG C-ARM 61-120 MIN; OPERATIVE RIGHT HIP WITH PELVIS COMPARISON:  11/09/2017 FINDINGS: Right total hip arthroplasty has been placed. There is anatomic alignment of the osseous and prostatic structures. No breakage or loosening of the hardware. No acute fracture. IMPRESSION: Right total hip arthroplasty placement anatomically aligned. Electronically Signed   By: Jolaine ClickArthur  Hoss M.D.   On: 11/10/2017 10:30   Dg Hip Unilat W Or Wo Pelvis 2-3 Views Right  Result Date: 11/09/2017 CLINICAL DATA:  Right hip pain after trauma. EXAM: DG HIP (WITH OR WITHOUT PELVIS) 2-3V RIGHT COMPARISON:  None. FINDINGS: Multiple cross-table lateral views of the pelvis. Suboptimal secondary to positioning and technique. Extensive artifact projects over the left hemipelvis. Sacroiliac joints are symmetric. Impacted, mildly laterally displaced mid right femoral neck fracture with mild comminution. Varus angulation. IMPRESSION: Right femoral neck fracture, as detailed above. Multifactorial degradation.  Electronically Signed   By: Jeronimo GreavesKyle  Talbot M.D.   On: 11/09/2017 12:21    Disposition: 01-Home or Self Care  Discharge Instructions    Discharge patient   Complete by:  As directed    After therapy session / stairs.   Discharge disposition:  01-Home or Self Care   Discharge patient date:  11/11/2017      Follow-up Information    Sheral ApleyMurphy, Timothy D, MD Follow up in 2 week(s).   Specialty:  Orthopedic Surgery Contact information: [address] [place] [id number] [phone]            Signed: Albina BilletHenry Calvin Martensen III PA-C 11/11/2017, 6:42 AM

## 2019-01-07 IMAGING — DX DG HIP (WITH OR WITHOUT PELVIS) 2-3V*R*
4 series · 4 of 4 positions shown · non-contrast
Comparison: None.

CLINICAL DATA: Right hip pain after trauma.

EXAM:
DG HIP (WITH OR WITHOUT PELVIS) 2-3V RIGHT

[hip ap]
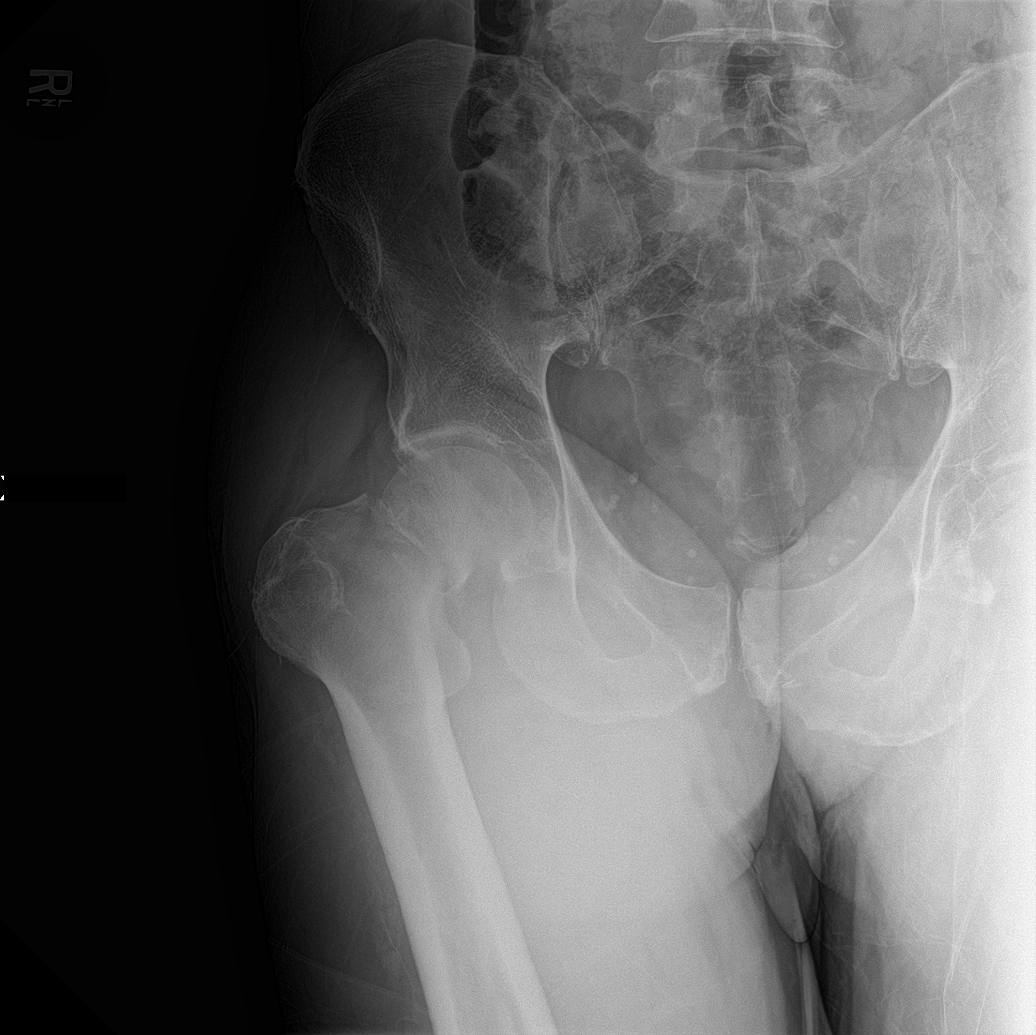

[pelvis ap (1 of 2)]
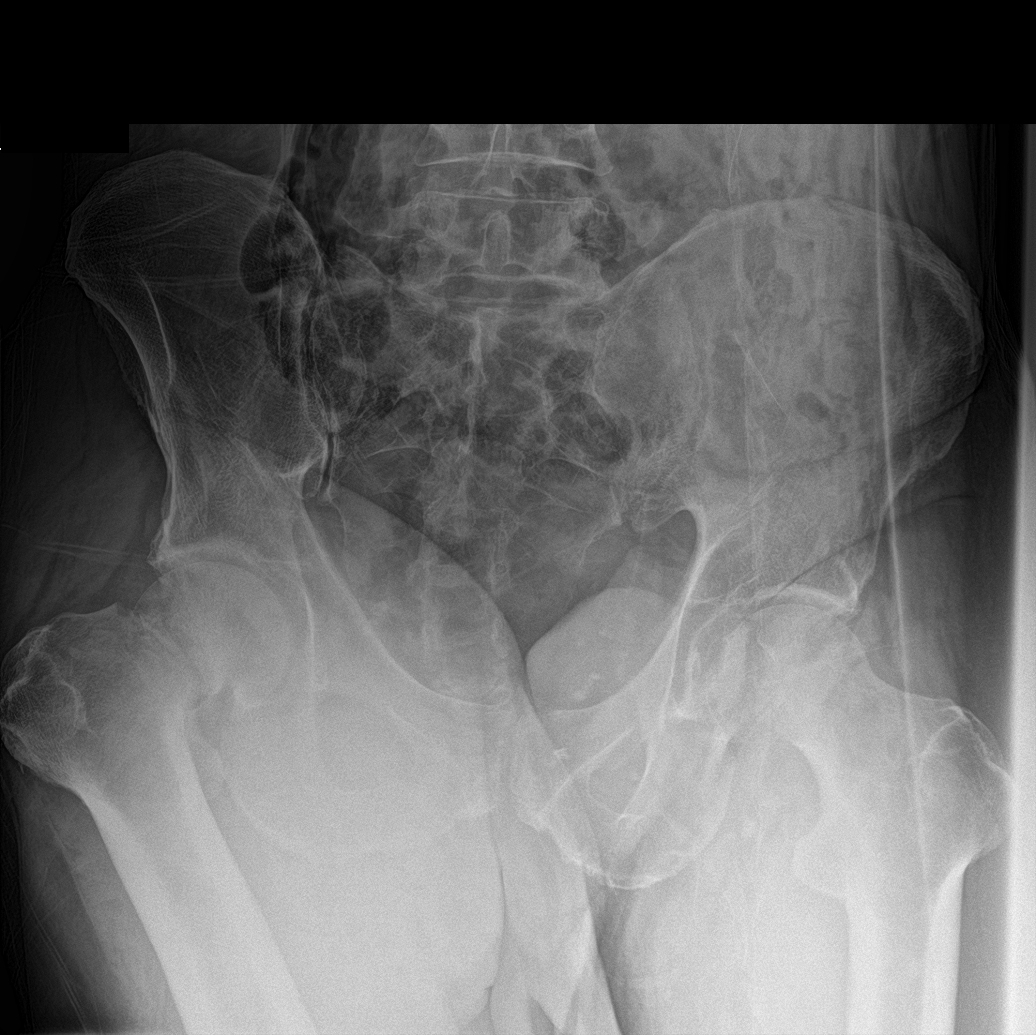

[pelvis ap (2 of 2)]
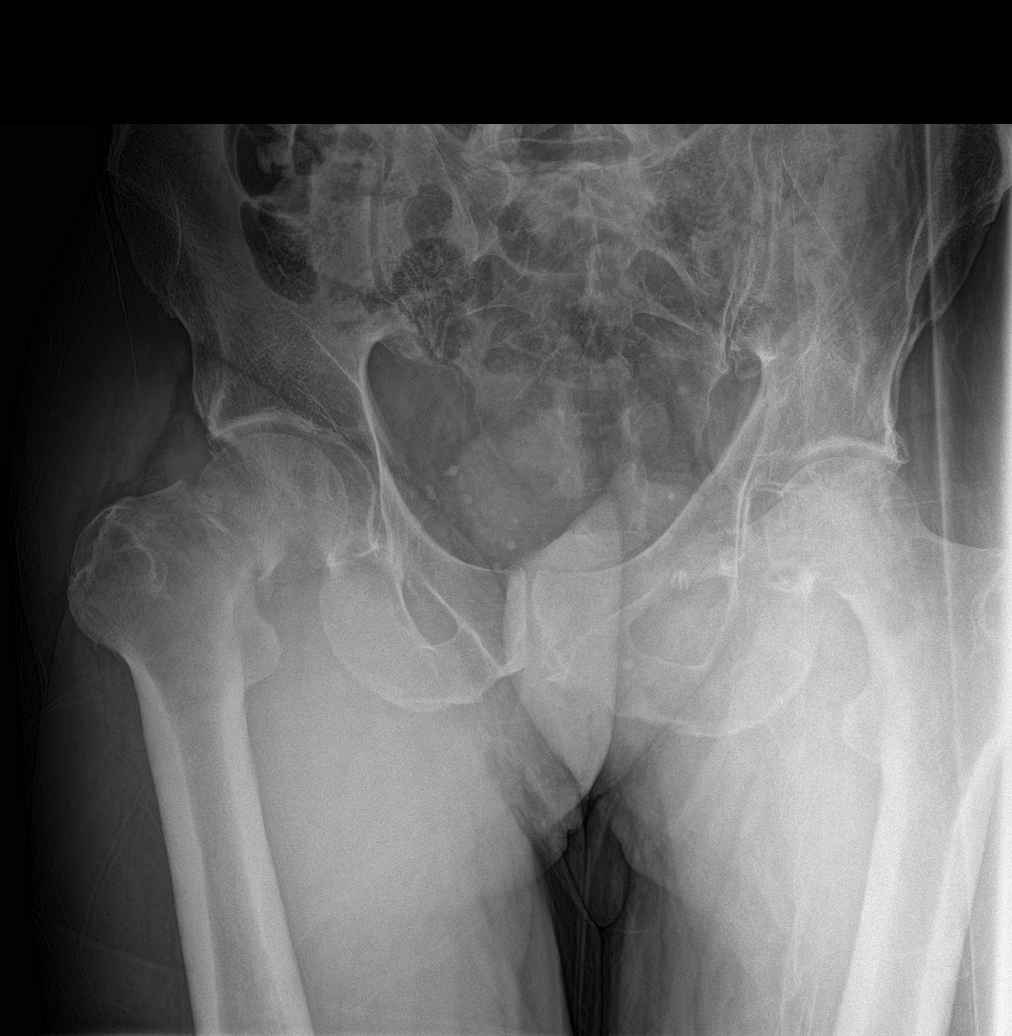

[hip lat]
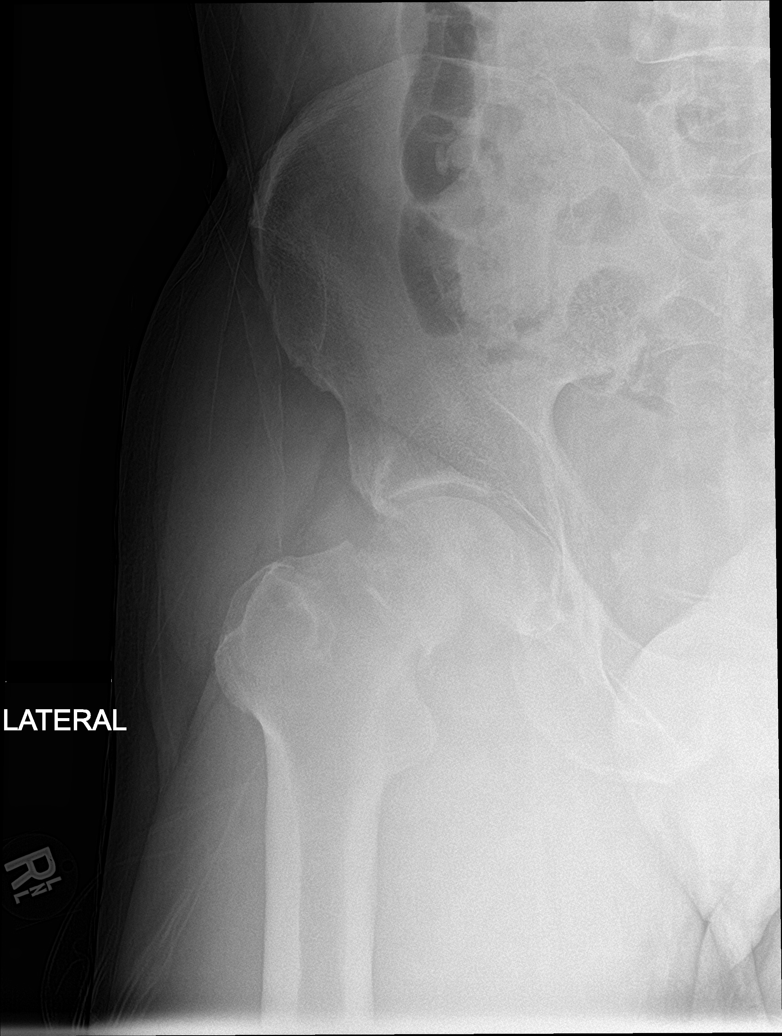

[4 of 4 positions shown; findings below may reference images not displayed]

FINDINGS: Multiple cross-table lateral views of the pelvis. Suboptimal
secondary to positioning and technique.

Extensive artifact projects over the left hemipelvis. Sacroiliac
joints are symmetric.

Impacted, mildly laterally displaced mid right femoral neck fracture
with mild comminution. Varus angulation.
IMPRESSION: Right femoral neck fracture, as detailed above.

Multifactorial degradation.

## 2019-01-08 IMAGING — RF DG C-ARM 61-120 MIN
1 series · 2 of 2 positions shown · non-contrast
Comparison: 11/09/2017

CLINICAL DATA: Hip fracture

EXAM:
DG C-ARM 61-120 MIN; OPERATIVE RIGHT HIP WITH PELVIS

[Series 1: run · 2 of 2 slices shown]
[im 1/2]
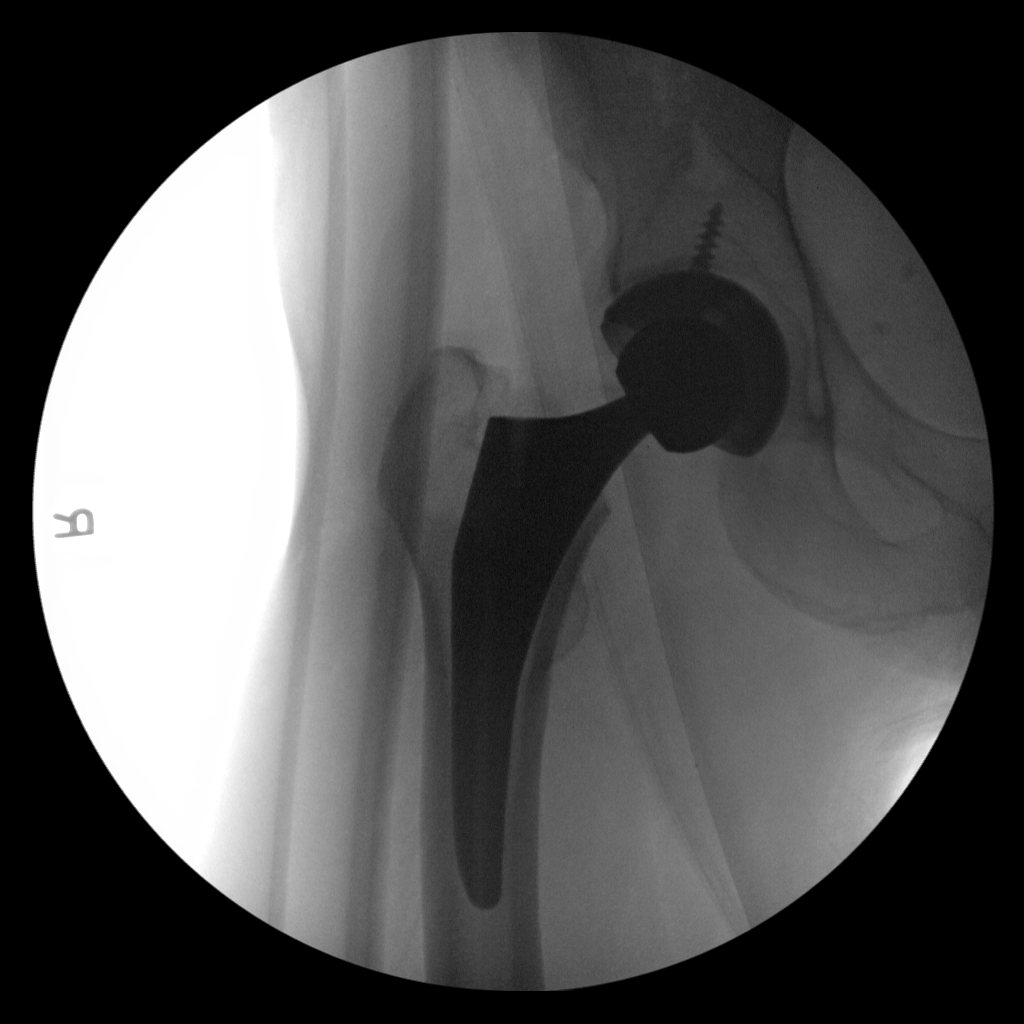
[im 2/2]
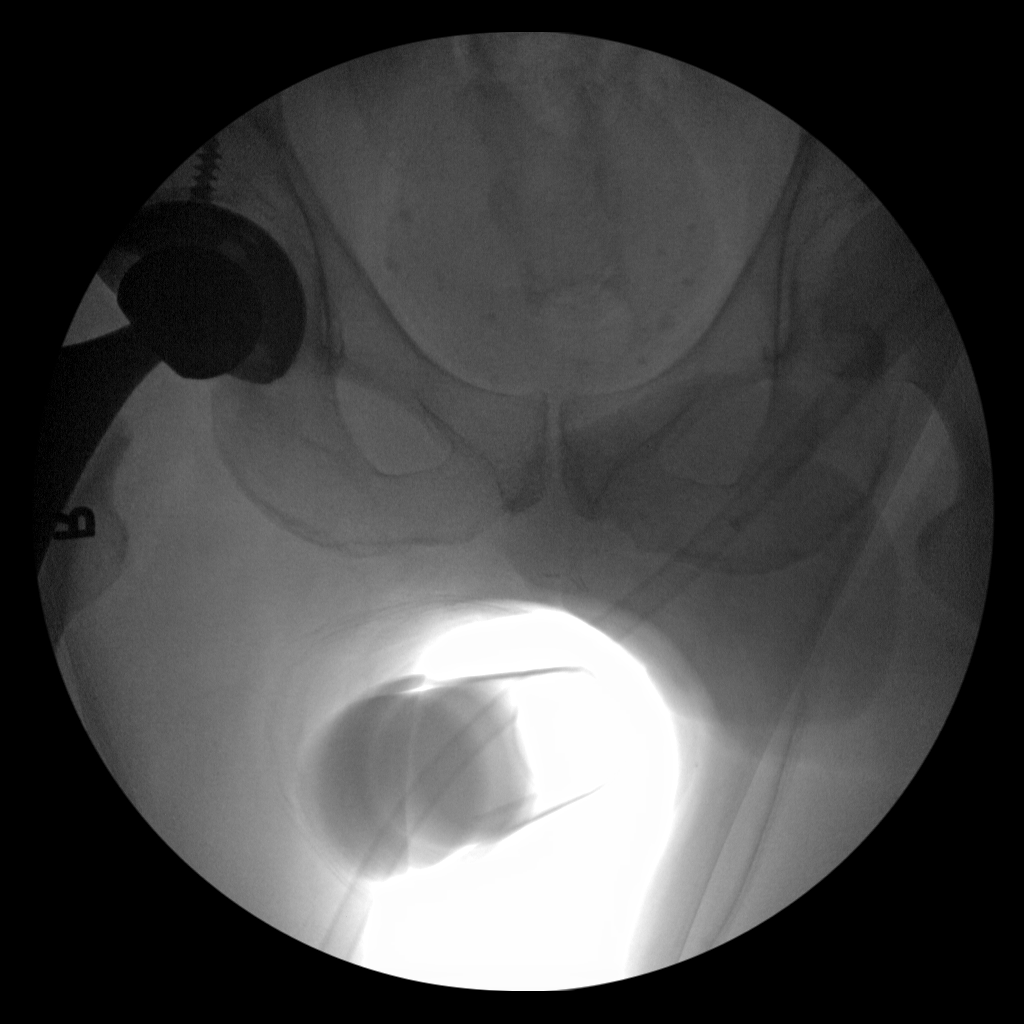

[2 of 2 positions shown; findings below may reference images not displayed]

FINDINGS: Right total hip arthroplasty has been placed. There is anatomic
alignment of the osseous and prostatic structures. No breakage or
loosening of the hardware. No acute fracture.
IMPRESSION: Right total hip arthroplasty placement anatomically aligned.

## 2021-03-06 ENCOUNTER — Ambulatory Visit: Payer: 59 | Attending: Internal Medicine

## 2021-03-06 DIAGNOSIS — Z20822 Contact with and (suspected) exposure to covid-19: Secondary | ICD-10-CM

## 2021-03-07 LAB — SARS-COV-2, NAA 2 DAY TAT

## 2021-03-07 LAB — NOVEL CORONAVIRUS, NAA: SARS-CoV-2, NAA: NOT DETECTED

## 2022-12-04 ENCOUNTER — Ambulatory Visit (INDEPENDENT_AMBULATORY_CARE_PROVIDER_SITE_OTHER): Payer: BC Managed Care – PPO | Admitting: Diagnostic Neuroimaging

## 2022-12-04 ENCOUNTER — Encounter: Payer: Self-pay | Admitting: Diagnostic Neuroimaging

## 2022-12-04 ENCOUNTER — Other Ambulatory Visit: Payer: Self-pay | Admitting: Diagnostic Neuroimaging

## 2022-12-04 ENCOUNTER — Telehealth: Payer: Self-pay | Admitting: Diagnostic Neuroimaging

## 2022-12-04 VITALS — BP 142/88 | HR 50 | Ht 74.0 in | Wt 220.2 lb

## 2022-12-04 DIAGNOSIS — R4689 Other symptoms and signs involving appearance and behavior: Secondary | ICD-10-CM | POA: Diagnosis not present

## 2022-12-04 DIAGNOSIS — R42 Dizziness and giddiness: Secondary | ICD-10-CM

## 2022-12-04 NOTE — Progress Notes (Signed)
[facility]  PATIENT: Ryan Foley DOB: [birth year]  REFERRING CLINICIAN: Loyola Mast, PA-C HISTORY FROM: patient  REASON FOR VISIT: new consult   HISTORICAL  CHIEF COMPLAINT:  Chief Complaint  Patient presents with   New Patient (Initial Visit)    Patient in room #6 and alone. Patient here today to discuss his impulse in his head that normally has at night time. Paitnet feels light headed and dizzy at times.    HISTORY OF PRESENT ILLNESS:   62 year old male here for evaluation of abnormal sensations.  2 years ago patient was laying down when all of a sudden he felt a "impulse sensation" in his head, and everything went black.  Interestingly this occurred when he was laying down to sleep in a dark room.  However things seem to darker than usual.  He immediately sat up and within a few seconds symptoms resolved.  That he was able to see his surroundings having normally appear in a darkened room.  Had some evaluation which was unremarkable.  Also had some sensation in the left chest, followed up with cardiology and testing was unremarkable.  In last couple of months and especially last couple weeks has had recurrence of the sensations including a wave like sensation in his head, random painful sensations in his head, dry mouth, nausea, heat sensation arising from his toes up to his head and then draining away.  This occurs 1-2 times a day although he did not have any sensations yesterday.    REVIEW OF SYSTEMS: Full 14 system review of systems performed and negative with exception of: As per HPI.  ALLERGIES: No Known Allergies  HOME MEDICATIONS: Outpatient Medications Prior to Visit  Medication Sig Dispense Refill   cholecalciferol (VITAMIN D3) 25 MCG (1000 UNIT) tablet Take 1,000 Units by mouth daily.     Multiple Vitamin (MULTIVITAMIN) capsule Take 1 capsule by mouth daily.     aspirin EC 325 MG tablet Take 1 tablet (325 mg total) by mouth daily.  For 30 days post op for DVT Prophylaxis (Patient not taking: Reported on 12/04/2022) 30 tablet 0   cetirizine (ZYRTEC) 10 MG tablet Take 10 mg by mouth daily as needed for allergies. (Patient not taking: Reported on 12/04/2022)     docusate sodium (COLACE) 100 MG capsule Take 1 capsule (100 mg total) by mouth 2 (two) times daily. To prevent constipation while taking pain medication. (Patient not taking: Reported on 12/04/2022) 60 capsule 0   HYDROcodone-acetaminophen (NORCO) 5-325 MG tablet Take 1-2 tablets by mouth every 4 (four) hours as needed for moderate pain. (Patient not taking: Reported on 12/04/2022) 40 tablet 0   methocarbamol (ROBAXIN) 500 MG tablet Take 1 tablet (500 mg total) by mouth every 6 (six) hours as needed for muscle spasms. (Patient not taking: Reported on 12/04/2022) 40 tablet 0   omeprazole (PRILOSEC) 20 MG capsule Take 1 capsule (20 mg total) by mouth daily. 30 days for gastroprotection while taking Aspirin. (Patient not taking: Reported on 12/04/2022) 30 capsule 0   ondansetron (ZOFRAN) 4 MG tablet Take 1 tablet (4 mg total) by mouth every 8 (eight) hours as needed for nausea or vomiting. (Patient not taking: Reported on 12/04/2022) 40 tablet 0   No facility-administered medications prior to visit.    PAST MEDICAL HISTORY: Past Medical History:  Diagnosis Date   Hemorrhoid    Medical history non-contributory    No pertinent past medical history     PAST SURGICAL HISTORY: Past Surgical  History:  Procedure Laterality Date   APPENDECTOMY     broken arm     left   HEMORRHOID SURGERY  10/17/2012   Procedure: HEMORRHOIDECTOMY;  Surgeon: [name], MD;  Location: [place];  Service: General;  Laterality: N/A;  single column internal hemorrhoidectomy   TONSILLECTOMY     TOTAL HIP ARTHROPLASTY Right 11/10/2017   Procedure: TOTAL HIP ARTHROPLASTY ANTERIOR APPROACH;  Surgeon: [name], MD;  Location: [place];  Service: Orthopedics;  Laterality: Right;    FAMILY  HISTORY: History reviewed. No pertinent family history.  SOCIAL HISTORY: Social History   Socioeconomic History   Marital status: Married    Spouse name: Not on file   Number of children: Not on file   Years of education: Not on file   Highest education level: Not on file  Occupational History   Not on file  Tobacco Use   Smoking status: Never   Smokeless tobacco: Never  Vaping Use   Vaping Use: Never used  Substance and Sexual Activity   Alcohol use: Yes    Comment: occsional   Drug use: No    Comment: occ   Sexual activity: Not on file  Other Topics Concern   Not on file  Social History Narrative   Not on file   Social Determinants of Health   Financial Resource Strain: Not on file  Food Insecurity: Not on file  Transportation Needs: Not on file  Physical Activity: Not on file  Stress: Not on file  Social Connections: Not on file  Intimate Partner Violence: Not on file     PHYSICAL EXAM  GENERAL EXAM/CONSTITUTIONAL: Vitals:  Vitals:   12/04/22 1045  BP: (!) 142/88  Pulse: (!) 50  Weight: 220 lb 3.2 oz (99.9 kg)  Height: 6\' 2"  (1.88 m)   Body mass index is 28.27 kg/m. Wt Readings from Last 3 Encounters:  12/04/22 220 lb 3.2 oz (99.9 kg)  11/09/17 210 lb (95.3 kg)  04/03/17 215 lb (97.5 kg)   Patient is in no distress; well developed, nourished and groomed; neck is supple  CARDIOVASCULAR: Examination of carotid arteries is normal; no carotid bruits Regular rate and rhythm, no murmurs Examination of peripheral vascular system by observation and palpation is normal  EYES: Ophthalmoscopic exam of optic discs and posterior segments is normal; no papilledema or hemorrhages No results found.  MUSCULOSKELETAL: Gait, strength, tone, movements noted in Neurologic exam below  NEUROLOGIC: MENTAL STATUS:      No data to display         awake, alert, oriented to person, place and time recent and remote memory intact normal attention and  concentration language fluent, comprehension intact, naming intact fund of knowledge appropriate  CRANIAL NERVE:  2nd - no papilledema on fundoscopic exam 2nd, 3rd, 4th, 6th - pupils equal and reactive to light, visual fields full to confrontation, extraocular muscles intact, no nystagmus 5th - facial sensation symmetric 7th - facial strength symmetric 8th - hearing intact 9th - palate elevates symmetrically, uvula midline 11th - shoulder shrug symmetric 12th - tongue protrusion midline  MOTOR:  normal bulk and tone, full strength in the BUE, BLE  SENSORY:  normal and symmetric to light touch, temperature, vibration  COORDINATION:  finger-nose-finger, fine finger movements normal  REFLEXES:  deep tendon reflexes present and symmetric  GAIT/STATION:  narrow based gait    DIAGNOSTIC DATA (LABS, IMAGING, TESTING) - I reviewed patient records, labs, notes, testing and imaging myself where available.  Lab Results  Component Value Date   WBC 9.0 11/11/2017   HGB 11.8 (L) 11/11/2017   HCT 35.2 (L) 11/11/2017   MCV 95.1 11/11/2017   PLT 195 11/11/2017      Component Value Date/Time   NA 136 11/11/2017 0622   K 3.7 11/11/2017 0622   CL 99 (L) 11/11/2017 0622   CO2 28 11/11/2017 0622   GLUCOSE 107 (H) 11/11/2017 0622   BUN 13 11/11/2017 0622   CREATININE 0.98 11/11/2017 0622   CALCIUM 8.4 (L) 11/11/2017 0622   PROT 6.3 (L) 11/09/2017 1129   ALBUMIN 3.8 11/09/2017 1129   AST 33 11/09/2017 1129   ALT 21 11/09/2017 1129   ALKPHOS 48 11/09/2017 1129   BILITOT 0.7 11/09/2017 1129   GFRNONAA >60 11/11/2017 0622   GFRAA >60 11/11/2017 0622   No results found for: "CHOL", "HDL", "LDLCALC", "LDLDIRECT", "TRIG", "CHOLHDL" No results found for: "HGBA1C" No results found for: "VITAMINB12" No results found for: "TSH"    ASSESSMENT AND PLAN  62 y.o. year old male here with:  Dx:  1. Spell of abnormal behavior     PLAN:  ABNORMAL SENSATIONS IN HEAD, BODY; few  seconds, wave like, electrical sensations, lightheaded, brain fog (ddx: complex partial seizure, cardiac arrhythmia, migraine variant, stress reaction) - check MRI brain, EEG, cardiac monitoring (30 day)  Orders Placed This Encounter  Procedures   MR BRAIN W WO CONTRAST   Cardiac event monitor   EEG adult   Return for pending if symptoms worsen or fail to improve, pending test results.    Suanne Marker, MD 12/04/2022, 11:41 AM Certified in Neurology, Neurophysiology and Neuroimaging  [facility] [address] [place], [place] [address] [phone]

## 2022-12-04 NOTE — Telephone Encounter (Signed)
Pt scheduled for MRI brain w/wo contrast at [facility] [facility] for 12/11/22 at [facility] BPZW#[id number] (12/04/22-01/02/23)

## 2022-12-11 ENCOUNTER — Ambulatory Visit: Payer: BC Managed Care – PPO

## 2022-12-11 DIAGNOSIS — R4689 Other symptoms and signs involving appearance and behavior: Secondary | ICD-10-CM | POA: Diagnosis not present

## 2022-12-11 MED ORDER — GADOBENATE DIMEGLUMINE 529 MG/ML IV SOLN
20.0000 mL | Freq: Once | INTRAVENOUS | Status: AC | PRN
  Administered 2022-12-11: 20 mL via INTRAVENOUS

## 2022-12-12 ENCOUNTER — Telehealth: Payer: Self-pay | Admitting: Neurology

## 2022-12-12 NOTE — Telephone Encounter (Signed)
-----  Message from Penni Bombard, MD sent at 12/11/2022  5:11 PM EST ----- Unremarkable imaging results. Please call patient. Continue current plan. -VRP

## 2022-12-12 NOTE — Telephone Encounter (Signed)
Called the patient and reviewed the MRI brain results. Advised that there was nothing that appeared concerning. Pt verbalized understanding. Pt had no questions at this time but was encouraged to call back if questions arise. Pt is scheduled 2/7 for EEG, advised once he reviewed those results we were reach out and review.

## 2022-12-14 ENCOUNTER — Ambulatory Visit: Payer: BC Managed Care – PPO | Attending: Diagnostic Neuroimaging

## 2022-12-14 DIAGNOSIS — R42 Dizziness and giddiness: Secondary | ICD-10-CM | POA: Diagnosis not present

## 2022-12-14 DIAGNOSIS — R4689 Other symptoms and signs involving appearance and behavior: Secondary | ICD-10-CM | POA: Diagnosis not present

## 2022-12-19 ENCOUNTER — Ambulatory Visit (INDEPENDENT_AMBULATORY_CARE_PROVIDER_SITE_OTHER): Payer: BC Managed Care – PPO | Admitting: Diagnostic Neuroimaging

## 2022-12-19 DIAGNOSIS — R4182 Altered mental status, unspecified: Secondary | ICD-10-CM

## 2022-12-19 DIAGNOSIS — R4689 Other symptoms and signs involving appearance and behavior: Secondary | ICD-10-CM

## 2022-12-25 NOTE — Procedures (Signed)
   [facility]  EEG (ELECTROENCEPHALOGRAM) REPORT   STUDY DATE: 12/19/22 PATIENT NAME: Ryan Foley DOB: 09/02/1961 MRN: [id number]  ORDERING CLINICIAN: Andrey Spearman, MD   TECHNOLOGIST: Myer Peer TECHNIQUE: Electroencephalogram was recorded utilizing standard 10-20 system of lead placement and reformatted into average and bipolar montages.  RECORDING TIME: 25 minutes ACTIVATION: hyperventilation and photic stimulation  CLINICAL INFORMATION: 62 year old male with abnormal spells.  FINDINGS: Posterior dominant background rhythms, which attenuate with eye opening, ranging 10-11 hertz and 20-30 microvolts. No focal, lateralizing, epileptiform activity or seizures are seen. Patient recorded in the awake and drowsy state. EKG channel shows regular rhythm of 40-50 beats per minute.   IMPRESSION:   Normal EEG in the awake and drowsy states.  Sinus bradycardia noted.   INTERPRETING PHYSICIAN:  Penni Bombard, MD Certified in Neurology, Neurophysiology and Neuroimaging  [facility] [address], [place], [place] [address] [phone]

## 2022-12-26 ENCOUNTER — Telehealth: Payer: Self-pay

## 2022-12-26 NOTE — Telephone Encounter (Signed)
Contacted pt, LVM rq CB  EEG was normal

## 2022-12-26 NOTE — Telephone Encounter (Signed)
Contacted pt again, no answer

## 2022-12-26 NOTE — Telephone Encounter (Signed)
-----   Message from [name], MD sent at 12/25/2022  5:57 PM EST ----- Normal EEG. Please call patient. Continue current plan. -VRP 

## 2022-12-26 NOTE — Telephone Encounter (Signed)
Pt returned call and was informed of test results. Pt understood and verbalized appreciation.

## 2023-01-02 ENCOUNTER — Telehealth: Payer: Self-pay | Admitting: Physician Assistant

## 2023-01-02 NOTE — Telephone Encounter (Signed)
Received page from [employer] [phone]) regarding new onset of atrial fibrillation occurred at 5:14 PM central time today, this was an auto triggered event suggesting the patient was likely asymptomatic, heart rate in the 170s beats per minute.  Second tracing sent to [employer] from 5:36 PM Central time showed the patient has already converted back to sinus rhythm with heart rate in the 80s.   It does not appears that the patient was ever seen by cardiology service.  Although [employer] suggested heart monitor was ordered by Dr. Sherren Mocha, however chart review suggest it was actually ordered by Dr. Leta Baptist of neurology service.  Will forward to Dr. Leta Baptist to see if he would like a cardiology referral.  Note, patient's heart rate was 50 bpm when he saw Dr. Leta Baptist on 12/04/2022.  Therefore, likely no room for rate control medication.

## 2023-01-03 ENCOUNTER — Other Ambulatory Visit: Payer: Self-pay | Admitting: Neurology

## 2023-01-03 DIAGNOSIS — R4689 Other symptoms and signs involving appearance and behavior: Secondary | ICD-10-CM

## 2023-01-03 DIAGNOSIS — I4891 Unspecified atrial fibrillation: Secondary | ICD-10-CM

## 2023-01-03 NOTE — Progress Notes (Signed)
Referral for cardiology placed.  

## 2023-01-03 NOTE — Telephone Encounter (Signed)
Left message to call the clinic.

## 2023-01-03 NOTE — Telephone Encounter (Signed)
   Cardiac Monitor Alert  Date of alert:  01/03/2023   Patient Name: Ryan Foley  DOB: February 01, 1961  MRN: [id number]   [place] Cardiologist: None   [facility] EP:  None    Monitor Information: Cardiac Event Monitor [[employer]]  Reason:  Per Neurology Ordering provider:  Dr. Leta Baptist, MD   Alert Atrial Fibrillation/Flutter This is the 1st alert for this rhythm.  The patient has no hx of Atrial Fibrillation/Flutter.  The patient is not currently on anticoagulation.  Next Cardiology Appointment   Date:    Provider:    The patient could NOT be reached by telephone today.    The patient was referred to the Atrial Fibrillation Clinic.  Appt date/time:  01/08/23 @ 10:00    Other:   Gershon Crane, LPN  [id number] AM

## 2023-01-08 ENCOUNTER — Ambulatory Visit (HOSPITAL_COMMUNITY)
Admission: RE | Admit: 2023-01-08 | Discharge: 2023-01-08 | Discharge status: Home / Self Care | Payer: BC Managed Care – PPO | Source: Ambulatory Visit | Attending: Physician Assistant | Admitting: Physician Assistant

## 2023-01-08 ENCOUNTER — Encounter (HOSPITAL_COMMUNITY): Payer: Self-pay | Admitting: Physician Assistant

## 2023-01-08 VITALS — BP 120/82 | HR 48 | Ht 74.0 in | Wt 221.6 lb

## 2023-01-08 DIAGNOSIS — R0683 Snoring: Secondary | ICD-10-CM | POA: Diagnosis not present

## 2023-01-08 DIAGNOSIS — I480 Paroxysmal atrial fibrillation: Secondary | ICD-10-CM | POA: Diagnosis not present

## 2023-01-08 DIAGNOSIS — R400 Somnolence: Secondary | ICD-10-CM | POA: Diagnosis not present

## 2023-01-08 NOTE — Progress Notes (Signed)
Primary Care Physician: Annye English Primary Cardiologist: none Primary Electrophysiologist: none Referring Physician: Dr [name] is a 62 y.o. male with a history of atrial fibrillation who presents for consultation in the [facility].  The patient was initially diagnosed with atrial fibrillation on an event monitor 01/02/23 which was placed by neurology for symptoms of a wave like sensation in his head, random painful sensations in his head, dry mouth, nausea, heat sensation arising from his toes up to his head and then draining away. He was seen by cardiology at [facility] in 2022 for chest pain, nuclear stress test which was negative for ischemia at that time. The event monitor showed an auto triggered event, heart rate in the 170s bpm, second tracing sent to [employer] from 5:36 PM Central time showed the patient has already converted back to sinus rhythm with heart rate in the 80s. Patient has a CHADS2VASC score of 0. Patient reports he had just finished exercising at the time of his afib and was unaware of his arrhythmia. He drinks alcohol occasionally. He does admit to snoring and daytime somnolence.   Today, he denies symptoms of palpitations, chest pain, shortness of breath, orthopnea, PND, lower extremity edema, dizziness, presyncope, syncope, bleeding, or neurologic sequela. The patient is tolerating medications without difficulties and is otherwise without complaint today.    Atrial Fibrillation Risk Factors:  he does have symptoms or diagnosis of sleep apnea. he is agreeable to sleep study.  he does not have a history of rheumatic fever. he does have a history of alcohol use. The patient does not have a history of early familial atrial fibrillation or other arrhythmias.  he has a BMI of Body mass index is 28.45 kg/m.Marland Kitchen Filed Weights   01/08/23 1005  Weight: 100.5 kg    No family history on file.   Atrial  Fibrillation Management history:  Previous antiarrhythmic drugs: none Previous cardioversions: none Previous ablations: none CHADS2VASC score: 0 Anticoagulation history: none   Past Medical History:  Diagnosis Date   Hemorrhoid    Medical history non-contributory    No pertinent past medical history    Past Surgical History:  Procedure Laterality Date   APPENDECTOMY     broken arm     left   HEMORRHOID SURGERY  10/17/2012   Procedure: HEMORRHOIDECTOMY;  Surgeon: [name], MD;  Location: [place];  Service: General;  Laterality: N/A;  single column internal hemorrhoidectomy   TONSILLECTOMY     TOTAL HIP ARTHROPLASTY Right 11/10/2017   Procedure: TOTAL HIP ARTHROPLASTY ANTERIOR APPROACH;  Surgeon: [name], MD;  Location: [place];  Service: Orthopedics;  Laterality: Right;    Current Outpatient Medications  Medication Sig Dispense Refill   cholecalciferol (VITAMIN D3) 25 MCG (1000 UNIT) tablet Take 1,000 Units by mouth daily.     Multiple Vitamin (MULTIVITAMIN) capsule Take 1 capsule by mouth daily.     No current facility-administered medications for this encounter.    No Known Allergies  Social History   Socioeconomic History   Marital status: Married    Spouse name: Not on file   Number of children: Not on file   Years of education: Not on file   Highest education level: Not on file  Occupational History   Not on file  Tobacco Use   Smoking status: Never   Smokeless tobacco: Never   Tobacco comments:    Never smoke 01/08/23  Vaping Use  Vaping Use: Never used  Substance and Sexual Activity   Alcohol use: Yes    Comment: occsional   Drug use: No    Comment: occ   Sexual activity: Not on file  Other Topics Concern   Not on file  Social History Narrative   Not on file   Social Determinants of Health   Financial Resource Strain: Not on file  Food Insecurity: Not on file  Transportation Needs: Not on file  Physical Activity: Not on file   Stress: Not on file  Social Connections: Not on file  Intimate Partner Violence: Not on file     ROS- All systems are reviewed and negative except as per the HPI above.  Physical Exam: Vitals:   01/08/23 1005  BP: 120/82  Pulse: (!) 48  Weight: 100.5 kg  Height: '6\' 2"'$  (1.88 m)    GEN- The patient is a well appearing male, alert and oriented x 3 today.   Head- normocephalic, atraumatic Eyes-  Sclera clear, conjunctiva pink Ears- hearing intact Oropharynx- clear Neck- supple  Lungs- Clear to ausculation bilaterally, normal work of breathing Heart- Regular rate and rhythm, bradycardia, no murmurs, rubs or gallops  GI- soft, NT, ND, + BS Extremities- no clubbing, cyanosis, or edema MS- no significant deformity or atrophy Skin- no rash or lesion Psych- euthymic mood, full affect Neuro- strength and sensation are intact  Wt Readings from Last 3 Encounters:  01/08/23 100.5 kg  12/04/22 99.9 kg  11/09/17 95.3 kg    EKG today demonstrates  SB Vent. rate 48 BPM PR interval 178 ms QRS duration 96 ms QT/QTcB 420/375 ms  Epic records are reviewed at length today  CHA2DS2-VASc Score = 0  The patient's score is based upon: CHF History: 0 HTN History: 0 Diabetes History: 0 Stroke History: 0 Vascular Disease History: 0 Age Score: 0 Gender Score: 0       ASSESSMENT AND PLAN: 1. Paroxysmal Atrial Fibrillation (ICD10:  I48.0) The patient's CHA2DS2-VASc score is 0, indicating a 0.2% annual risk of stroke.   General education about afib provided and questions answered. We also discussed his stroke risk and the risks and benefits of anticoagulation. With low CV score, anticoagulation not indicated at this time.  Unclear if afib related to nocturnal symptoms as he was completely asymptomatic during the detected episode.  Will look for final report of event monitor for afib burden.  ? Sleep apnea. Previous cardiac testing at [facility] reviewed in care everywhere.   2.  Snoring/daytime somnolence  The importance of adequate treatment of sleep apnea was discussed today in order to improve our ability to maintain sinus rhythm long term. Will refer for sleep study.    Follow up in the AF clinic in one month.    [facility] [address] [place], [place] [address] [phone] 01/08/2023 11:18 AM

## 2023-02-04 ENCOUNTER — Telehealth: Payer: Self-pay | Admitting: *Deleted

## 2023-02-04 DIAGNOSIS — I480 Paroxysmal atrial fibrillation: Secondary | ICD-10-CM

## 2023-02-04 NOTE — Telephone Encounter (Signed)
Prior Authorization for SPLIT NIGHT sent to [facility] via web portal. Tracking Number .  DENIED-Order ID: [id number] NOT MET

## 2023-02-07 NOTE — Addendum Note (Signed)
Addended by: Juluis Mire on: 02/07/2023 04:51 PM   Modules accepted: Orders

## 2023-02-07 NOTE — Telephone Encounter (Addendum)
Home sleep study order placed. °

## 2023-02-12 ENCOUNTER — Encounter (HOSPITAL_COMMUNITY): Payer: Self-pay | Admitting: Physician Assistant

## 2023-02-12 ENCOUNTER — Ambulatory Visit (HOSPITAL_COMMUNITY)
Admission: RE | Admit: 2023-02-12 | Discharge: 2023-02-12 | Disposition: A | Discharge status: Home / Self Care | Payer: BC Managed Care – PPO | Source: Ambulatory Visit | Attending: Physician Assistant | Admitting: Physician Assistant

## 2023-02-12 VITALS — BP 114/84 | HR 55 | Ht 74.0 in | Wt 220.0 lb

## 2023-02-12 DIAGNOSIS — I480 Paroxysmal atrial fibrillation: Secondary | ICD-10-CM | POA: Diagnosis present

## 2023-02-12 DIAGNOSIS — R400 Somnolence: Secondary | ICD-10-CM | POA: Insufficient documentation

## 2023-02-12 DIAGNOSIS — R0683 Snoring: Secondary | ICD-10-CM | POA: Insufficient documentation

## 2023-02-12 NOTE — Progress Notes (Signed)
Primary Care Physician: Loyola Mast, PA-C Primary Cardiologist: none Primary Electrophysiologist: none Referring Physician: Dr Ryan Foley is a 62 y.o. male with a history of atrial fibrillation who presents for follow up in the [facility].  The patient was initially diagnosed with atrial fibrillation on an event monitor 01/02/23 which was placed by neurology for symptoms of a wave like sensation in his head, random painful sensations in his head, dry mouth, nausea, heat sensation arising from his toes up to his head and then draining away. He was seen by cardiology at [facility] in 2022 for chest pain, nuclear stress test which was negative for ischemia at that time. The event monitor showed an auto triggered event, heart rate in the 170s bpm, second tracing sent to [facility] from 5:36 PM Central time showed the patient has already converted back to sinus rhythm with heart rate in the 80s. Patient has a CHADS2VASC score of 0. Patient reports he had just finished exercising at the time of his afib and was unaware of his arrhythmia. He drinks alcohol occasionally. He does admit to snoring and daytime somnolence.   On follow up today, the event monitor showed no additional episodes of afib but he continues to have the "vibrating, wave-like" sensations at night. Home sleep study is pending.   Today, he denies symptoms of chest pain, shortness of breath, orthopnea, PND, lower extremity edema, dizziness, presyncope, syncope, bleeding.The patient is tolerating medications without difficulties and is otherwise without complaint today.    Atrial Fibrillation Risk Factors:  he does have symptoms or diagnosis of sleep apnea. he is agreeable to sleep study.  he does not have a history of rheumatic fever. he does have a history of alcohol use. The patient does not have a history of early familial atrial fibrillation or other arrhythmias.  he has  a BMI of Body mass index is 28.25 kg/m.Marland Kitchen Filed Weights   02/12/23 0829  Weight: 99.8 kg    No family history on file.   Atrial Fibrillation Management history:  Previous antiarrhythmic drugs: none Previous cardioversions: none Previous ablations: none CHADS2VASC score: 0 Anticoagulation history: none   Past Medical History:  Diagnosis Date   Hemorrhoid    Medical history non-contributory    No pertinent past medical history    Past Surgical History:  Procedure Laterality Date   APPENDECTOMY     broken arm     left   HEMORRHOID SURGERY  10/17/2012   Procedure: HEMORRHOIDECTOMY;  Surgeon: [name], MD;  Location: [place];  Service: General;  Laterality: N/A;  single column internal hemorrhoidectomy   TONSILLECTOMY     TOTAL HIP ARTHROPLASTY Right 11/10/2017   Procedure: TOTAL HIP ARTHROPLASTY ANTERIOR APPROACH;  Surgeon: [name], MD;  Location: [place];  Service: Orthopedics;  Laterality: Right;    Current Outpatient Medications  Medication Sig Dispense Refill   cholecalciferol (VITAMIN D3) 25 MCG (1000 UNIT) tablet Take 1,000 Units by mouth daily.     Multiple Vitamin (MULTIVITAMIN) capsule Take 1 capsule by mouth daily.     No current facility-administered medications for this encounter.    No Known Allergies  Social History   Socioeconomic History   Marital status: Married    Spouse name: Not on file   Number of children: Not on file   Years of education: Not on file   Highest education level: Not on file  Occupational History   Not on  file  Tobacco Use   Smoking status: Never   Smokeless tobacco: Never   Tobacco comments:    Never smoke 01/08/23  Vaping Use   Vaping Use: Never used  Substance and Sexual Activity   Alcohol use: Yes    Comment: occsional   Drug use: No    Comment: occ   Sexual activity: Not on file  Other Topics Concern   Not on file  Social History Narrative   Not on file   Social Determinants of Health    Financial Resource Strain: Not on file  Food Insecurity: Not on file  Transportation Needs: Not on file  Physical Activity: Not on file  Stress: Not on file  Social Connections: Not on file  Intimate Partner Violence: Not on file     ROS- All systems are reviewed and negative except as per the HPI above.  Physical Exam: Vitals:   02/12/23 0829  BP: 114/84  Pulse: (!) 55  Weight: 99.8 kg  Height: 6\' 2"  (1.88 m)    GEN- The patient is a well appearing male, alert and oriented x 3 today.   HEENT-head normocephalic, atraumatic, sclera clear, conjunctiva pink, hearing intact, trachea midline. Lungs- Clear to ausculation bilaterally, normal work of breathing Heart- Regular rate and rhythm, no murmurs, rubs or gallops  GI- soft, NT, ND, + BS Extremities- no clubbing, cyanosis, or edema MS- no significant deformity or atrophy Skin- no rash or lesion Psych- euthymic mood, full affect Neuro- strength and sensation are intact   Wt Readings from Last 3 Encounters:  02/12/23 99.8 kg  01/08/23 100.5 kg  12/04/22 99.9 kg    EKG today demonstrates  SB, T wave changes similar to previous Vent. rate 55 BPM PR interval 170 ms QRS duration 96 ms QT/QTcB 410/392 ms  Epic records are reviewed at length today  CHA2DS2-VASc Score = 0  The patient's score is based upon: CHF History: 0 HTN History: 0 Diabetes History: 0 Stroke History: 0 Vascular Disease History: 0 Age Score: 0 Gender Score: 0       ASSESSMENT AND PLAN: 1. Paroxysmal Atrial Fibrillation (ICD10:  I48.0) The patient's CHA2DS2-VASc score is 0, indicating a 0.2% annual risk of stroke.   Event monitor showed only one 8 minute episode. Patient triggered symptoms associated with SR. Do not feel his symptoms are arrhythmogenic.  With low CV score, anticoagulation not indicated at this time.   2. Snoring/daytime somnolence  Home sleep study ordered.    Follow up with Dr Ryan Foley for further evaluation. AF  clinic as needed.    [facility] [address] [place], [place] [address] [phone] 02/12/2023 8:48 AM

## 2023-03-18 ENCOUNTER — Ambulatory Visit (HOSPITAL_BASED_OUTPATIENT_CLINIC_OR_DEPARTMENT_OTHER): Payer: BC Managed Care – PPO | Attending: Physician Assistant | Admitting: Cardiology

## 2023-03-18 VITALS — Ht 74.0 in | Wt 215.0 lb

## 2023-03-18 DIAGNOSIS — I480 Paroxysmal atrial fibrillation: Secondary | ICD-10-CM

## 2023-03-18 DIAGNOSIS — G4733 Obstructive sleep apnea (adult) (pediatric): Secondary | ICD-10-CM

## 2023-03-20 NOTE — Procedures (Signed)
     Patient Name: Ryan Foley, Darley Date: 03/18/2023 Gender: Male D.O.B: 1961-06-17 Age (years): 23 Referring Provider: Alphonzo Severance PA Height (inches): 74 Interpreting Physician: Armanda Magic MD, ABSM Weight (lbs): 215 RPSGT: Arnegard Sink BMI: 28 MRN: [id number] Neck Size: 17.00  CLINICAL INFORMATION Sleep Study Type: HST  Indication for sleep study: N/A  Epworth Sleepiness Score: 6  SLEEP STUDY TECHNIQUE A multi-channel overnight portable sleep study was performed. The channels recorded were: nasal airflow, thoracic respiratory movement, and oxygen saturation with a pulse oximetry. Snoring was also monitored.  MEDICATIONS Patient self administered medications include: N/A.  SLEEP ARCHITECTURE Patient was studied for 427 minutes. The sleep efficiency was 100.0 % and the patient was supine for 0%. The arousal index was 0.0 per hour.  RESPIRATORY PARAMETERS The overall AHI was 16.4 per hour, with a central apnea index of 0 per hour.  The oxygen nadir was 89% during sleep.  CARDIAC DATA Mean heart rate during sleep was 54.4 bpm.  IMPRESSIONS - Moderate obstructive sleep apnea occurred during this study (AHI = 16.4/h). - Mild oxygen desaturation was noted during this study (Min O2 = 89%). - Patient snored 1.4% during the sleep.  DIAGNOSIS - Obstructive Sleep Apnea (G47.33)  RECOMMENDATIONS - Recommend in lab CPAP titration.  - Positional therapy avoiding supine position during sleep. - Avoid alcohol, sedatives and other CNS depressants that may worsen sleep apnea and disrupt normal sleep architecture. - Sleep hygiene should be reviewed to assess factors that may improve sleep quality. - Weight management and regular exercise should be initiated or continued.  [Electronically signed] 03/20/2023 03:44 PM  Armanda Magic MD, ABSM Diplomate, American Board of Sleep Medicine

## 2023-04-01 ENCOUNTER — Telehealth: Payer: Self-pay

## 2023-04-01 NOTE — Telephone Encounter (Signed)
Left VM for patient to return call for sleep study results.

## 2023-04-02 ENCOUNTER — Encounter: Payer: Self-pay | Admitting: *Deleted
